# Patient Record
Sex: Male | Born: 1975 | Race: White | Hispanic: No | Marital: Married | Smoking: Current every day smoker
Health system: Southern US, Community
[De-identification: ages and names within clinical notes are randomized; demographics above are authoritative.]

## PROBLEM LIST (undated history)

## (undated) DIAGNOSIS — F329 Major depressive disorder, single episode, unspecified: Secondary | ICD-10-CM

## (undated) DIAGNOSIS — G8929 Other chronic pain: Secondary | ICD-10-CM

## (undated) DIAGNOSIS — F32A Depression, unspecified: Secondary | ICD-10-CM

## (undated) HISTORY — PX: DENTAL RESTORATION/EXTRACTION WITH X-RAY: SHX5796

---

## 2005-08-10 ENCOUNTER — Emergency Department (HOSPITAL_COMMUNITY): Admission: EM | Admit: 2005-08-10 | Discharge: 2005-08-10 | Payer: Self-pay | Admitting: Family Medicine

## 2005-08-14 ENCOUNTER — Emergency Department (HOSPITAL_COMMUNITY): Admission: EM | Admit: 2005-08-14 | Discharge: 2005-08-14 | Payer: Self-pay | Admitting: Family Medicine

## 2007-01-05 DIAGNOSIS — S62609A Fracture of unspecified phalanx of unspecified finger, initial encounter for closed fracture: Secondary | ICD-10-CM | POA: Insufficient documentation

## 2007-03-22 ENCOUNTER — Ambulatory Visit: Payer: Self-pay | Admitting: Internal Medicine

## 2007-03-31 ENCOUNTER — Ambulatory Visit (HOSPITAL_COMMUNITY): Admission: RE | Admit: 2007-03-31 | Discharge: 2007-03-31 | Payer: Self-pay | Admitting: Internal Medicine

## 2007-04-09 ENCOUNTER — Emergency Department (HOSPITAL_COMMUNITY): Admission: EM | Admit: 2007-04-09 | Discharge: 2007-04-09 | Payer: Self-pay | Admitting: *Deleted

## 2007-04-14 ENCOUNTER — Emergency Department (HOSPITAL_COMMUNITY): Admission: EM | Admit: 2007-04-14 | Discharge: 2007-04-14 | Payer: Self-pay | Admitting: Emergency Medicine

## 2007-06-30 DIAGNOSIS — M545 Low back pain, unspecified: Secondary | ICD-10-CM | POA: Insufficient documentation

## 2007-10-08 ENCOUNTER — Ambulatory Visit: Payer: Self-pay | Admitting: Internal Medicine

## 2007-10-08 DIAGNOSIS — M542 Cervicalgia: Secondary | ICD-10-CM

## 2007-10-08 DIAGNOSIS — F17200 Nicotine dependence, unspecified, uncomplicated: Secondary | ICD-10-CM

## 2007-10-08 DIAGNOSIS — B360 Pityriasis versicolor: Secondary | ICD-10-CM | POA: Insufficient documentation

## 2007-10-08 DIAGNOSIS — K029 Dental caries, unspecified: Secondary | ICD-10-CM | POA: Insufficient documentation

## 2007-10-13 ENCOUNTER — Encounter (INDEPENDENT_AMBULATORY_CARE_PROVIDER_SITE_OTHER): Payer: Self-pay | Admitting: Internal Medicine

## 2007-10-13 LAB — CONVERTED CEMR LAB
CO2: 26 meq/L (ref 19–32)
Calcium: 9.4 mg/dL (ref 8.4–10.5)
Chloride: 104 meq/L (ref 96–112)
Cholesterol: 166 mg/dL (ref 0–200)
Creatinine, Ser: 0.88 mg/dL (ref 0.40–1.50)
Eosinophils Relative: 4 % (ref 0–5)
Glucose, Bld: 79 mg/dL (ref 70–99)
HCT: 46.5 % (ref 39.0–52.0)
Hemoglobin: 15.7 g/dL (ref 13.0–17.0)
Lymphocytes Relative: 29 % (ref 12–46)
Lymphs Abs: 2.8 10*3/uL (ref 0.7–4.0)
Monocytes Absolute: 0.8 10*3/uL (ref 0.1–1.0)
RBC: 5.44 M/uL (ref 4.22–5.81)
Total Bilirubin: 0.5 mg/dL (ref 0.3–1.2)
Total CHOL/HDL Ratio: 3.7
Total Protein: 7.2 g/dL (ref 6.0–8.3)
Triglycerides: 132 mg/dL (ref <150)
VLDL: 26 mg/dL (ref 0–40)
WBC: 9.8 10*3/uL (ref 4.0–10.5)

## 2008-02-11 ENCOUNTER — Ambulatory Visit: Payer: Self-pay | Admitting: Internal Medicine

## 2008-03-16 ENCOUNTER — Ambulatory Visit: Payer: Self-pay | Admitting: Internal Medicine

## 2008-03-16 DIAGNOSIS — G44209 Tension-type headache, unspecified, not intractable: Secondary | ICD-10-CM

## 2008-05-14 ENCOUNTER — Emergency Department (HOSPITAL_COMMUNITY): Admission: EM | Admit: 2008-05-14 | Discharge: 2008-05-14 | Payer: Self-pay | Admitting: Emergency Medicine

## 2010-11-01 ENCOUNTER — Emergency Department (HOSPITAL_COMMUNITY)
Admission: EM | Admit: 2010-11-01 | Discharge: 2010-11-01 | Payer: Self-pay | Source: Home / Self Care | Admitting: Emergency Medicine

## 2011-07-22 LAB — WOUND CULTURE

## 2011-11-26 ENCOUNTER — Emergency Department (HOSPITAL_COMMUNITY): Payer: Self-pay

## 2011-11-26 ENCOUNTER — Encounter (HOSPITAL_COMMUNITY): Payer: Self-pay

## 2011-11-26 ENCOUNTER — Emergency Department (HOSPITAL_COMMUNITY)
Admission: EM | Admit: 2011-11-26 | Discharge: 2011-11-26 | Disposition: A | Discharge status: Home / Self Care | Payer: Self-pay | Attending: Emergency Medicine | Admitting: Emergency Medicine

## 2011-11-26 DIAGNOSIS — M79609 Pain in unspecified limb: Secondary | ICD-10-CM | POA: Insufficient documentation

## 2011-11-26 DIAGNOSIS — M79643 Pain in unspecified hand: Secondary | ICD-10-CM

## 2011-11-26 MED ORDER — IBUPROFEN 600 MG PO TABS: 600.0000 mg | ORAL_TABLET | Freq: Four times a day (QID) | ORAL | Status: AC | PRN

## 2011-11-26 MED ORDER — IBUPROFEN 800 MG PO TABS
800.0000 mg | ORAL_TABLET | Freq: Once | ORAL | Status: DC
  Filled 2011-11-26: qty 1

## 2011-11-26 NOTE — ED Notes (Signed)
Pt presents with 1 month h/o R hand pain.  Pt reports pain x 1 month, unsure if he injured hand.  Pt reports swelling to hand.

## 2011-11-26 NOTE — Discharge Instructions (Signed)
Return to the ED with any concerns including worsening pain, swelling of your arm or hand, discoloration of the skin of your arm hand or fingers, or any other alarming symptoms.

## 2011-11-26 NOTE — ED Provider Notes (Signed)
History     CSN: [id number]  Arrival date & time 11/26/11  1335   First MD Initiated Contact with Patient 11/26/11 1646      Chief Complaint  Patient presents with  . Hand Pain    (Consider location/radiation/quality/duration/timing/severity/associated sxs/prior treatment) HPI Patient presents with complaint of pain in his hands. He states that he has had an injury to his left hand several months ago but then pain began to worsen this morning. He said no other injury. He denies any weakness or numbness. There is no erythema of his hand. Movement and palpation make the pain worse. Pain has been constant and sharp today. There no associated systemic symptoms.  Pain is located over the dorsum of his right hand.  There is no weakness of the hand  History reviewed. No pertinent past medical history.  History reviewed. No pertinent past surgical history.  No family history on file.  History  Substance Use Topics  . Smoking status: Current Everyday Smoker -- 1.0 packs/day  . Smokeless tobacco: Not on file  . Alcohol Use: Yes      Review of Systems ROS reviewed and otherwise negative except for mentioned in HPI  Allergies  Review of patient's allergies indicates no known allergies.  Home Medications   Current Outpatient Rx  Name Route Sig Dispense Refill  . ALPRAZOLAM 2 MG PO TABS Oral Take 2 mg by mouth 4 (four) times daily.    . CYCLOBENZAPRINE HCL 10 MG PO TABS Oral Take 10 mg by mouth 2 (two) times daily as needed. For muscle pain    . SERTRALINE HCL 50 MG PO TABS Oral Take 50 mg by mouth daily.    Marland Kitchen ZOLPIDEM TARTRATE 10 MG PO TABS Oral Take 10 mg by mouth at bedtime as needed. For sleep    . IBUPROFEN 600 MG PO TABS Oral Take 1 tablet (600 mg total) by mouth every 6 (six) hours as needed for pain. 30 tablet 0    BP 113/80  Pulse 92  Temp(Src) 98.2 F (36.8 C) (Oral)  Resp 18  Ht 5\' 4"  (1.626 m)  Wt 132 lb (59.875 kg)  BMI 22.66 kg/m2  SpO2 96% Vitals  reviewed Physical Exam Physical Examination: General appearance - alert, well appearing, and in no distress Mental status - alert, oriented to person, place, and time Chest - clear to auscultation, no wheezes, rales or rhonchi, symmetric air entry Heart - normal rate, regular rhythm, normal S1, S2, no murmurs, rubs, clicks or gallops Neurological - alert, oriented, normal speech, grip strength 5/5 and symmetric bilaterally, sensation/flexion and extension intact in fingers Musculoskeletal - no joint tenderness, deformity or swelling, no snuffbox tenderness, small approx 1cm area of prominence over right dorsal hand, no overlying erythema, hand diffusely tender to palpation- pt moves hand away with even lightest touch of skin Extremities - peripheral pulses normal, no pedal edema, no clubbing or cyanosis Skin - normal coloration and turgor, no rashes  ED Course  Procedures (including critical care time)  Labs Reviewed - No data to display Dg Hand Complete Right  11/26/2011  *RADIOLOGY REPORT*  Clinical Data: Pain in metacarpal region.  Occasional numbness and tingling. History of injury in January.  RIGHT HAND - COMPLETE 3+ VIEW  Comparison: 11/01/2010.  Findings: The configuration of the fifth metacarpal is unchanged consistent with old healed fracture.  There is no evidence of acute fracture.  No dislocation is seen.  Alignment is normal.  Small benign-appearing cystic area in the  proximal aspect of the middle phalanx of the fifth finger is stable.  A tiny opaque density is seen projecting over the distal metaphysis of the middle phalanx of the index finger on the radial side. On the lateral image it is projecting near the dorsal surface.  On the previous study this density was seen as well as several adjacent opaque densities.  This is consistent with a small metallic density foreign body.  IMPRESSION: No acute fracture or acute bony abnormality.  Old healed fracture of the fifth metacarpal.  1 mm  metallic density opaque foreign body is seen in the soft tissues of the index finger.  It is unchanged in position from the previous study.  Original Report Authenticated By: Crawford Givens, M.D.     1. Pain in hand       MDM  Patient presents with complaint of pain in his right hand. He has a mild area of swelling over the dorsum of his hand which is consistent in appearance with possible ganglion cyst. He does have a remote fracture on his x-ray which may be contributing to the area of swelling. I do not suspect an acute injury or infection or any other acute emergent condition and his hand at this time. Patient was recommended to take ibuprofen for his discomfort and was discharged in good condition. He was advised to followup with primary care Dr. information was provided to contact primary care.        Ethelda Chick, MD 11/28/11 [phone]

## 2014-06-30 ENCOUNTER — Emergency Department (HOSPITAL_COMMUNITY)
Admission: EM | Admit: 2014-06-30 | Discharge: 2014-06-30 | Disposition: A | Discharge status: Home / Self Care | Payer: Medicaid Other | Attending: Emergency Medicine | Admitting: Emergency Medicine

## 2014-06-30 ENCOUNTER — Emergency Department (HOSPITAL_COMMUNITY): Payer: Medicaid Other

## 2014-06-30 ENCOUNTER — Encounter (HOSPITAL_COMMUNITY): Payer: Self-pay | Admitting: Emergency Medicine

## 2014-06-30 DIAGNOSIS — IMO0002 Reserved for concepts with insufficient information to code with codable children: Secondary | ICD-10-CM | POA: Insufficient documentation

## 2014-06-30 DIAGNOSIS — M5489 Other dorsalgia: Secondary | ICD-10-CM

## 2014-06-30 DIAGNOSIS — Z79899 Other long term (current) drug therapy: Secondary | ICD-10-CM | POA: Diagnosis not present

## 2014-06-30 DIAGNOSIS — W108XXA Fall (on) (from) other stairs and steps, initial encounter: Secondary | ICD-10-CM | POA: Diagnosis not present

## 2014-06-30 DIAGNOSIS — Y9389 Activity, other specified: Secondary | ICD-10-CM | POA: Insufficient documentation

## 2014-06-30 DIAGNOSIS — Y9289 Other specified places as the place of occurrence of the external cause: Secondary | ICD-10-CM | POA: Diagnosis not present

## 2014-06-30 DIAGNOSIS — F17200 Nicotine dependence, unspecified, uncomplicated: Secondary | ICD-10-CM | POA: Diagnosis not present

## 2014-06-30 DIAGNOSIS — W1809XA Striking against other object with subsequent fall, initial encounter: Secondary | ICD-10-CM | POA: Diagnosis not present

## 2014-06-30 DIAGNOSIS — W19XXXA Unspecified fall, initial encounter: Secondary | ICD-10-CM

## 2014-06-30 MED ORDER — METHOCARBAMOL 500 MG PO TABS: 500.0000 mg | ORAL_TABLET | Freq: Four times a day (QID) | ORAL | Status: DC | PRN

## 2014-06-30 MED ORDER — HYDROCODONE-ACETAMINOPHEN 5-325 MG PO TABS: 2.0000 | ORAL_TABLET | ORAL | Status: DC | PRN

## 2014-06-30 MED ORDER — HYDROCODONE-ACETAMINOPHEN 5-325 MG PO TABS
2.0000 | ORAL_TABLET | Freq: Once | ORAL | Status: AC
  Administered 2014-06-30: 2 via ORAL
  Filled 2014-06-30: qty 2

## 2014-06-30 NOTE — ED Provider Notes (Signed)
Medical screening examination/treatment/procedure(s) were performed by non-physician practitioner and as supervising physician I was immediately available for consultation/collaboration.    Giani Winther D Everest Hacking, MD 06/30/14 1915 

## 2014-06-30 NOTE — ED Provider Notes (Signed)
CSN: [id number]     Arrival date & time 06/30/14  4098 History  This chart was scribed for non-physician practitioner Roger Dredge, PA-C working with Vida Roller, MD by Leone Payor, ED Scribe. This patient was seen in room TR11C/TR11C and the patient's care was started at 10:26 AM.    Chief Complaint  Patient presents with  . Back Pain    The history is provided by the patient. No language interpreter was used.    HPI Comments: Roger Oconnell is a 38 y.o. male who presents to the Emergency Department complaining of a fall that occurred PTA. Patient states he slipped on wet stairs and fell, striking his lower back. He denies head injury or LOC. He complains of constant, unchanged, non-radiating lower back pain that is described as stabbing. He states the pain is aggravated by walking and alleviated with rest. He has not taken OTC medication for his symptoms. He denies numbness, weakness, head pain, neck pain, chest pain, nausea, vomiting, bowel/bladder incontinence.   History reviewed. No pertinent past medical history. History reviewed. No pertinent past surgical history. History reviewed. No pertinent family history. History  Substance Use Topics  . Smoking status: Current Every Day Smoker -- 1.00 packs/day  . Smokeless tobacco: Not on file  . Alcohol Use: Yes    Review of Systems  Respiratory: Negative for shortness of breath.   Cardiovascular: Negative for chest pain.  Gastrointestinal: Negative for nausea, vomiting and abdominal pain.  Musculoskeletal: Positive for back pain. Negative for neck pain.  Skin: Negative for wound.  Neurological: Negative for dizziness, syncope, weakness, numbness and headaches.      Allergies  Review of patient's allergies indicates no known allergies.  Home Medications   Prior to Admission medications   Medication Sig Start Date End Date Taking? Authorizing Provider  alprazolam Prudy Feeler) 2 MG tablet Take 2 mg by mouth 4 (four) times daily.     Historical Provider, MD  cyclobenzaprine (FLEXERIL) 10 MG tablet Take 10 mg by mouth 2 (two) times daily as needed. For muscle pain    Historical Provider, MD  sertraline (ZOLOFT) 50 MG tablet Take 50 mg by mouth daily.    Historical Provider, MD  zolpidem (AMBIEN) 10 MG tablet Take 10 mg by mouth at bedtime as needed. For sleep    Historical Provider, MD   BP 112/74  Pulse 83  Temp(Src) 98.1 F (36.7 C) (Oral)  Resp 12  SpO2 100% Physical Exam  Nursing note and vitals reviewed. Constitutional: He appears well-developed and well-nourished. No distress.  HENT:  Head: Normocephalic and atraumatic.  Neck: Neck supple.  Cardiovascular: Normal rate.   Pulmonary/Chest: Effort normal.  Abdominal: Soft. He exhibits no distension and no mass. There is no tenderness. There is no rebound and no guarding.  Musculoskeletal:  Tenderness to palpation over lower thoracic and upper lumbar spine. No crepitus or stepoffs. No skin changes. Lower extremities:  Strength 5/5, sensation intact, distal pulses intact.  Bilateral hips non tender.   Neurological: He is alert.  Skin: He is not diaphoretic.  Gait is normal.   ED Course  Procedures (including critical care time)  DIAGNOSTIC STUDIES: Oxygen Saturation is 100% on RA, normal by my interpretation.    COORDINATION OF CARE: 10:32 AM Will order XRAY of thoracic and lumbar spine. Discussed treatment plan with pt at bedside and pt agreed to plan.   Labs Review Labs Reviewed - No data to display  Imaging Review Dg Thoracic Spine 2 View  06/30/2014   CLINICAL DATA:  Back pain.  Fell downstairs this morning.  EXAM: THORACIC SPINE - 2 VIEW  COMPARISON:  None.  FINDINGS: There is no evidence of thoracic spine fracture. Alignment is normal. No other significant bone abnormalities are identified.  IMPRESSION: Negative.   Electronically Signed   By: Amie Portland M.D.   On: 06/30/2014 11:17   Dg Lumbar Spine Complete  06/30/2014   CLINICAL DATA:  Status  post fall.  Low back pain.  EXAM: LUMBAR SPINE - COMPLETE 4+ VIEW  COMPARISON:  Plain films lumbar spine 11/01/2010.  FINDINGS: There is no evidence of lumbar spine fracture. Alignment is normal. Intervertebral disc spaces are maintained.  IMPRESSION: Negative exam.   Electronically Signed   By: Drusilla Kanner M.D.   On: 06/30/2014 11:17     EKG Interpretation None      MDM   Final diagnoses:  Fall, initial encounter  Midline back pain, unspecified location    Afebrile, nontoxic patient with low back pain after slip and fall on steps, hitting back on stairs. No red flags. Xrays negative.  Discussed result, findings, treatment, and follow up  with patient.  Pt given return precautions.  Pt verbalizes understanding and agrees with plan.      I personally performed the services described in this documentation, which was scribed in my presence. The recorded information has been reviewed and is accurate.   Roger Dredge, PA-C 06/30/14 1214

## 2014-06-30 NOTE — ED Notes (Signed)
Slipped on stairs hitting his back.  Having lower back pain.

## 2014-06-30 NOTE — Discharge Planning (Signed)
[facility] with the patient regarding primary care resources and establishing care with a provider. Orange card application and instructions provided. Resource guide and my contact information also given for any future questions or concerns. No other community liaison needs identified at this time.

## 2014-06-30 NOTE — Discharge Instructions (Signed)
Read the information below.  Use the prescribed medication as directed.  Please discuss all new medications with your pharmacist.  Do not take additional tylenol while taking the prescribed pain medication to avoid overdose.  You may return to the Emergency Department at any time for worsening condition or any new symptoms that concern you.   If you develop fevers, loss of control of bowel or bladder, weakness or numbness in your legs, or are unable to walk, return to the ER for a recheck.    Back Pain, Adult Back pain is very common. The pain often gets better over time. The cause of back pain is usually not dangerous. Most people can learn to manage their back pain on their own.  HOME CARE   Stay active. Start with short walks on flat ground if you can. Try to walk farther each day.  Do not sit, drive, or stand in one place for more than 30 minutes. Do not stay in bed.  Do not avoid exercise or work. Activity can help your back heal faster.  Be careful when you bend or lift an object. Bend at your knees, keep the object close to you, and do not twist.  Sleep on a firm mattress. Lie on your side, and bend your knees. If you lie on your back, put a pillow under your knees.  Only take medicines as told by your doctor.  Put ice on the injured area.  Put ice in a plastic bag.  Place a towel between your skin and the bag.  Leave the ice on for 15-20 minutes, 03-04 times a day for the first 2 to 3 days. After that, you can switch between ice and heat packs.  Ask your doctor about back exercises or massage.  Avoid feeling anxious or stressed. Find good ways to deal with stress, such as exercise. GET HELP RIGHT AWAY IF:   Your pain does not go away with rest or medicine.  Your pain does not go away in 1 week.  You have new problems.  You do not feel well.  The pain spreads into your legs.  You cannot control when you poop (bowel movement) or pee (urinate).  Your arms or legs feel  weak or lose feeling (numbness).  You feel sick to your stomach (nauseous) or throw up (vomit).  You have belly (abdominal) pain.  You feel like you may pass out (faint). MAKE SURE YOU:   Understand these instructions.  Will watch your condition.  Will get help right away if you are not doing well or get worse. Document Released: 03/10/2008 Document Revised: 12/15/2011 Document Reviewed: 01/24/2014 [facility] Patient Information 2015 [place], [place]. This information is not intended to replace advice given to you by your health care provider. Make sure you discuss any questions you have with your health care provider.  Fall Prevention and Home Safety Falls cause injuries and can affect all age groups. It is possible to prevent falls.  HOW TO PREVENT FALLS  Wear shoes with rubber soles that do not have an opening for your toes.  Keep the inside and outside of your house well lit.  Use night lights throughout your home.  Remove clutter from floors.  Clean up floor spills.  Remove throw rugs or fasten them to the floor with carpet tape.  Do not place electrical cords across pathways.  Put grab bars by your tub, shower, and toilet. Do not use towel bars as grab bars.  Put handrails on  both sides of the stairway. Fix loose handrails.  Do not climb on stools or stepladders, if possible.  Do not wax your floors.  Repair uneven or unsafe sidewalks, walkways, or stairs.  Keep items you use a lot within reach.  Be aware of pets.  Keep emergency numbers next to the telephone.  Put smoke detectors in your home and near bedrooms. Ask your doctor what other things you can do to prevent falls. Document Released: 07/19/2009 Document Revised: 03/23/2012 Document Reviewed: 12/23/2011 [facility] Patient Information 2015 [place], [place]. This information is not intended to replace advice given to you by your health care provider. Make sure you discuss any questions you have with your  health care provider.

## 2014-07-14 ENCOUNTER — Ambulatory Visit: Payer: Medicaid Other | Attending: Internal Medicine

## 2015-07-23 ENCOUNTER — Encounter (HOSPITAL_COMMUNITY): Payer: Self-pay | Admitting: Emergency Medicine

## 2015-07-23 ENCOUNTER — Emergency Department (HOSPITAL_COMMUNITY)
Admission: EM | Admit: 2015-07-23 | Discharge: 2015-07-24 | Disposition: A | Discharge status: Home / Self Care | Payer: Medicaid Other | Attending: Emergency Medicine | Admitting: Emergency Medicine

## 2015-07-23 DIAGNOSIS — Z720 Tobacco use: Secondary | ICD-10-CM | POA: Diagnosis not present

## 2015-07-23 DIAGNOSIS — L03211 Cellulitis of face: Secondary | ICD-10-CM | POA: Diagnosis not present

## 2015-07-23 DIAGNOSIS — L0201 Cutaneous abscess of face: Secondary | ICD-10-CM | POA: Diagnosis present

## 2015-07-23 DIAGNOSIS — Z79899 Other long term (current) drug therapy: Secondary | ICD-10-CM | POA: Diagnosis not present

## 2015-07-23 NOTE — ED Notes (Signed)
Pt. reports skin abscess at left lower chin with drainage onset 3 days ago .

## 2015-07-24 LAB — BASIC METABOLIC PANEL
ANION GAP: 7 (ref 5–15)
BUN: 11 mg/dL (ref 6–20)
CALCIUM: 8.8 mg/dL — AB (ref 8.9–10.3)
CHLORIDE: 102 mmol/L (ref 101–111)
CO2: 27 mmol/L (ref 22–32)
CREATININE: 0.87 mg/dL (ref 0.61–1.24)
GFR calc non Af Amer: >60 mL/min (ref >60)
GLUCOSE: 95 mg/dL (ref 65–99)
Potassium: 4.2 mmol/L (ref 3.5–5.1)
Sodium: 136 mmol/L (ref 135–145)

## 2015-07-24 LAB — CBC WITH DIFFERENTIAL/PLATELET
BASOS PCT: 1 %
Basophils Absolute: 0.1 10*3/uL (ref 0.0–0.1)
Eosinophils Absolute: 0.7 10*3/uL (ref 0.0–0.7)
Eosinophils Relative: 6 %
HEMATOCRIT: 40.5 % (ref 39.0–52.0)
HEMOGLOBIN: 13.8 g/dL (ref 13.0–17.0)
LYMPHS PCT: 33 %
Lymphs Abs: 3.9 10*3/uL (ref 0.7–4.0)
MCH: 29.2 pg (ref 26.0–34.0)
MCHC: 34.1 g/dL (ref 30.0–36.0)
MCV: 85.6 fL (ref 78.0–100.0)
MONO ABS: 1 10*3/uL (ref 0.1–1.0)
MONOS PCT: 8 %
NEUTROS ABS: 6.1 10*3/uL (ref 1.7–7.7)
NEUTROS PCT: 52 %
Platelets: 243 10*3/uL (ref 150–400)
RBC: 4.73 MIL/uL (ref 4.22–5.81)
RDW: 14.2 % (ref 11.5–15.5)
WBC: 11.7 10*3/uL — ABNORMAL HIGH (ref 4.0–10.5)

## 2015-07-24 MED ORDER — OXYCODONE-ACETAMINOPHEN 5-325 MG PO TABS: 2.0000 | ORAL_TABLET | ORAL | Status: DC | PRN

## 2015-07-24 MED ORDER — CLINDAMYCIN PHOSPHATE 900 MG/50ML IV SOLN
900.0000 mg | Freq: Once | INTRAVENOUS | Status: AC
  Administered 2015-07-24: 900 mg via INTRAVENOUS
  Filled 2015-07-24: qty 50

## 2015-07-24 MED ORDER — SODIUM CHLORIDE 0.9 % IV BOLUS (SEPSIS)
1000.0000 mL | Freq: Once | INTRAVENOUS | Status: AC
  Administered 2015-07-24: 1000 mL via INTRAVENOUS

## 2015-07-24 MED ORDER — OXYCODONE-ACETAMINOPHEN 5-325 MG PO TABS
2.0000 | ORAL_TABLET | Freq: Once | ORAL | Status: AC
  Administered 2015-07-24: 2 via ORAL
  Filled 2015-07-24: qty 2

## 2015-07-24 MED ORDER — CLINDAMYCIN HCL 150 MG PO CAPS: 150.0000 mg | ORAL_CAPSULE | Freq: Four times a day (QID) | ORAL | Status: DC

## 2015-07-24 NOTE — Discharge Instructions (Signed)
Cellulitis °Cellulitis is an infection of the skin and the tissue beneath it. The infected area is usually red and tender. Cellulitis occurs most often in the arms and lower legs.  °CAUSES  °Cellulitis is caused by bacteria that enter the skin through cracks or cuts in the skin. The most common types of bacteria that cause cellulitis are staphylococci and streptococci. °SIGNS AND SYMPTOMS  °· Redness and warmth. °· Swelling. °· Tenderness or pain. °· Fever. °DIAGNOSIS  °Your health care provider can usually determine what is wrong based on a physical exam. Blood tests may also be done. °TREATMENT  °Treatment usually involves taking an antibiotic medicine. °HOME CARE INSTRUCTIONS  °· Take your antibiotic medicine as directed by your health care provider. Finish the antibiotic even if you start to feel better. °· Keep the infected arm or leg elevated to reduce swelling. °· Apply a warm cloth to the affected area up to 4 times per day to relieve pain. °· Take medicines only as directed by your health care provider. °· Keep all follow-up visits as directed by your health care provider. °SEEK MEDICAL CARE IF:  °· You notice red streaks coming from the infected area. °· Your red area gets larger or turns dark in color. °· Your bone or joint underneath the infected area becomes painful after the skin has healed. °· Your infection returns in the same area or another area. °· You notice a swollen bump in the infected area. °· You develop new symptoms. °· You have a fever. °SEEK IMMEDIATE MEDICAL CARE IF:  °· You feel very sleepy. °· You develop vomiting or diarrhea. °· You have a general ill feeling (malaise) with muscle aches and pains. °  °This information is not intended to replace advice given to you by your health care provider. Make sure you discuss any questions you have with your health care provider. °  °Document Released: 07/02/2005 Document Revised: 06/13/2015 Document Reviewed: 12/08/2011 °Elsevier Interactive  Patient Education ©2016 [employer] ° °Abscess °An abscess is an infected area that contains a collection of pus and debris. It can occur in almost any part of the body. An abscess is also known as a furuncle or boil. °CAUSES  °An abscess occurs when tissue gets infected. This can occur from blockage of oil or sweat glands, infection of hair follicles, or a minor injury to the skin. As the body tries to fight the infection, pus collects in the area and creates pressure under the skin. This pressure causes pain. People with weakened immune systems have difficulty fighting infections and get certain abscesses more often.  °SYMPTOMS °Usually an abscess develops on the skin and becomes a painful mass that is red, warm, and tender. If the abscess forms under the skin, you may feel a moveable soft area under the skin. Some abscesses break open (rupture) on their own, but most will continue to get worse without care. The infection can spread deeper into the body and eventually into the bloodstream, causing you to feel ill.  °DIAGNOSIS  °Your caregiver will take your medical history and perform a physical exam. A sample of fluid may also be taken from the abscess to determine what is causing your infection. °TREATMENT  °Your caregiver may prescribe antibiotic medicines to fight the infection. However, taking antibiotics alone usually does not cure an abscess. Your caregiver may need to make a small cut (incision) in the abscess to drain the pus. In some cases, gauze is packed into the abscess to reduce   pain and to continue draining the area. °HOME CARE INSTRUCTIONS  °· Only take over-the-counter or prescription medicines for pain, discomfort, or fever as directed by your caregiver. °· If you were prescribed antibiotics, take them as directed. Finish them even if you start to feel better. °· If gauze is used, follow your caregiver's directions for changing the gauze. °· To avoid spreading the infection: °¨ Keep your  draining abscess covered with a bandage. °¨ Wash your hands well. °¨ Do not share personal care items, towels, or whirlpools with others. °¨ Avoid skin contact with others. °· Keep your skin and clothes clean around the abscess. °· Keep all follow-up appointments as directed by your caregiver. °SEEK MEDICAL CARE IF:  °· You have increased pain, swelling, redness, fluid drainage, or bleeding. °· You have muscle aches, chills, or a general ill feeling. °· You have a fever. °MAKE SURE YOU:  °· Understand these instructions. °· Will watch your condition. °· Will get help right away if you are not doing well or get worse. °  °This information is not intended to replace advice given to you by your health care provider. Make sure you discuss any questions you have with your health care provider. °  °Document Released: 07/02/2005 Document Revised: 03/23/2012 Document Reviewed: 12/05/2011 °Elsevier Interactive Patient Education ©2016 Elsevier Inc. ° °

## 2015-07-24 NOTE — ED Provider Notes (Signed)
CSN: [id number]     Arrival date & time 07/23/15  2310 History   First MD Initiated Contact with Patient 07/23/15 2337     Chief Complaint  Patient presents with  . Abscess     (Consider location/radiation/quality/duration/timing/severity/associated sxs/prior Treatment) Patient is a 39 y.o. male presenting with abscess. The history is provided by the patient. No language interpreter was used.  Abscess Location:  Face Facial abscess location:  Face Size:  3 Abscess quality: draining, redness and warmth   Red streaking: no   Progression:  Worsening Chronicity:  New Context: not skin injury   Relieved by:  Nothing Worsened by:  Nothing tried Ineffective treatments:  None tried Associated symptoms: fever     History reviewed. No pertinent past medical history. History reviewed. No pertinent past surgical history. No family history on file. Social History  Substance Use Topics  . Smoking status: Current Every Day Smoker -- 0.00 packs/day  . Smokeless tobacco: None  . Alcohol Use: Yes    Review of Systems  Constitutional: Positive for fever.  All other systems reviewed and are negative.     Allergies  Review of patient's allergies indicates no known allergies.  Home Medications   Prior to Admission medications   Medication Sig Start Date End Date Taking? Authorizing Provider  alprazolam Prudy Feeler) 2 MG tablet Take 2 mg by mouth 4 (four) times daily.    Historical Provider, MD  cyclobenzaprine (FLEXERIL) 10 MG tablet Take 10 mg by mouth 2 (two) times daily as needed. For muscle pain    Historical Provider, MD  HYDROcodone-acetaminophen (NORCO/VICODIN) 5-325 MG per tablet Take 2 tablets by mouth every 4 (four) hours as needed for moderate pain or severe pain. 06/30/14   [name], PA-C  methocarbamol (ROBAXIN) 500 MG tablet Take 1 tablet (500 mg total) by mouth every 6 (six) hours as needed for muscle spasms (or pain). 06/30/14   [name], PA-C  sertraline (ZOLOFT) 50 MG  tablet Take 50 mg by mouth daily.    Historical Provider, MD  zolpidem (AMBIEN) 10 MG tablet Take 10 mg by mouth at bedtime as needed. For sleep    Historical Provider, MD   BP 99/64 mmHg  Pulse 85  Temp(Src) 99.3 F (37.4 C) (Oral)  Resp 20  Ht  (1.626 m)  Wt 130 lb (58.968 kg)  BMI 22.30 kg/m2  SpO2 96% Physical Exam  Constitutional: He is oriented to person, place, and time. He appears well-developed and well-nourished.  HENT:  Head: Normocephalic and atraumatic.  Open draining abscess left chin,  Erythema to chest  Eyes: Pupils are equal, round, and reactive to light.  Neck:  Erythema left neck  Cardiovascular: Normal rate.   Musculoskeletal: Normal range of motion.  Neurological: He is alert and oriented to person, place, and time.  Skin: Skin is warm.  Psychiatric: He has a normal mood and affect.  Nursing note and vitals reviewed.   ED Course  Procedures (including critical care time) Labs Review Labs Reviewed  CBC WITH DIFFERENTIAL/PLATELET - Abnormal; Notable for the following:    WBC 11.7 (*)    All other components within normal limits  BASIC METABOLIC PANEL    Imaging Review No results found. I have personally reviewed and evaluated these images and lab results as part of my medical decision-making.   EKG Interpretation None      MDM Pt initially refused Iv antibiotics,  Pt  given iv clindamycin after discussing options.  Marland Kitchen  Pt advised he needs to recheck here Wednesday morning.  Soak area.    Final diagnoses:  Cellulitis, face    Clindamycin 300mg  28 one po qid Percocet [address]16     Roger Oconnell Roger MadisonSofia, PA-C 07/24/15 0123  Elson AreasLeslie Roger Aziel Morgan, PA-C 07/24/15 0149  Rolan BuccoMelanie Belfi, MD 07/25/15 2122

## 2015-07-24 NOTE — ED Notes (Signed)
Pt refused IV and blood draw; Pt states he does not want to be here long;

## 2015-12-11 ENCOUNTER — Encounter (HOSPITAL_COMMUNITY): Payer: Self-pay | Admitting: Cardiology

## 2015-12-11 ENCOUNTER — Emergency Department (HOSPITAL_COMMUNITY)
Admission: EM | Admit: 2015-12-11 | Discharge: 2015-12-11 | Disposition: A | Discharge status: Home / Self Care | Payer: Medicaid Other | Attending: Emergency Medicine | Admitting: Emergency Medicine

## 2015-12-11 DIAGNOSIS — Y9389 Activity, other specified: Secondary | ICD-10-CM | POA: Insufficient documentation

## 2015-12-11 DIAGNOSIS — S61411A Laceration without foreign body of right hand, initial encounter: Secondary | ICD-10-CM | POA: Diagnosis not present

## 2015-12-11 DIAGNOSIS — Z79899 Other long term (current) drug therapy: Secondary | ICD-10-CM | POA: Diagnosis not present

## 2015-12-11 DIAGNOSIS — S6991XA Unspecified injury of right wrist, hand and finger(s), initial encounter: Secondary | ICD-10-CM | POA: Diagnosis present

## 2015-12-11 DIAGNOSIS — Y9289 Other specified places as the place of occurrence of the external cause: Secondary | ICD-10-CM | POA: Diagnosis not present

## 2015-12-11 DIAGNOSIS — Y998 Other external cause status: Secondary | ICD-10-CM | POA: Diagnosis not present

## 2015-12-11 DIAGNOSIS — Z23 Encounter for immunization: Secondary | ICD-10-CM | POA: Diagnosis not present

## 2015-12-11 DIAGNOSIS — W260XXA Contact with knife, initial encounter: Secondary | ICD-10-CM | POA: Diagnosis not present

## 2015-12-11 DIAGNOSIS — F17210 Nicotine dependence, cigarettes, uncomplicated: Secondary | ICD-10-CM | POA: Insufficient documentation

## 2015-12-11 MED ORDER — HYDROCODONE-ACETAMINOPHEN 5-325 MG PO TABS: 2.0000 | ORAL_TABLET | ORAL | Status: DC | PRN

## 2015-12-11 MED ORDER — OXYCODONE-ACETAMINOPHEN 5-325 MG PO TABS
2.0000 | ORAL_TABLET | Freq: Once | ORAL | Status: AC
  Administered 2015-12-11: 2 via ORAL
  Filled 2015-12-11: qty 2

## 2015-12-11 MED ORDER — LIDOCAINE HCL (PF) 1 % IJ SOLN
5.0000 mL | Freq: Once | INTRAMUSCULAR | Status: AC
  Administered 2015-12-11: 5 mL via INTRADERMAL
  Filled 2015-12-11: qty 5

## 2015-12-11 MED ORDER — TETANUS-DIPHTH-ACELL PERTUSSIS 5-2.5-18.5 LF-MCG/0.5 IM SUSP
0.5000 mL | Freq: Once | INTRAMUSCULAR | Status: AC
  Administered 2015-12-11: 0.5 mL via INTRAMUSCULAR
  Filled 2015-12-11: qty 0.5

## 2015-12-11 NOTE — Discharge Instructions (Signed)
Laceration Care, Adult Have your sutures removed in 10 days by her primary care physician. Return for redness or drainage from the wound. Take Tylenol for pain and hydrocodone for breakthrough pain. A laceration is a cut that goes through all layers of the skin. The cut also goes into the tissue that is right under the skin. Some cuts heal on their own. Others need to be closed with stitches (sutures), staples, skin adhesive strips, or wound glue. Taking care of your cut lowers your risk of infection and helps your cut to heal better. HOW TO TAKE CARE OF YOUR CUT For stitches or staples:  Keep the wound clean and dry.  If you were given a bandage (dressing), you should change it at least one time per day or as told by your doctor. You should also change it if it gets wet or dirty.  Keep the wound completely dry for the first 24 hours or as told by your doctor. After that time, you may take a shower or a bath. However, make sure that the wound is not soaked in water until after the stitches or staples have been removed.  Clean the wound one time each day or as told by your doctor:  Wash the wound with soap and water.  Rinse the wound with water until all of the soap comes off.  Pat the wound dry with a clean towel. Do not rub the wound.  After you clean the wound, put a thin layer of antibiotic ointment on it as told by your doctor. This ointment:  Helps to prevent infection.  Keeps the bandage from sticking to the wound.  Have your stitches or staples removed as told by your doctor. If your doctor used skin adhesive strips:   Keep the wound clean and dry.  If you were given a bandage, you should change it at least one time per day or as told by your doctor. You should also change it if it gets dirty or wet.  Do not get the skin adhesive strips wet. You can take a shower or a bath, but be careful to keep the wound dry.  If the wound gets wet, pat it dry with a clean towel. Do not  rub the wound.  Skin adhesive strips fall off on their own. You can trim the strips as the wound heals. Do not remove any strips that are still stuck to the wound. They will fall off after a while. If your doctor used wound glue:  Try to keep your wound dry, but you may briefly wet it in the shower or bath. Do not soak the wound in water, such as by swimming.  After you take a shower or a bath, gently pat the wound dry with a clean towel. Do not rub the wound.  Do not do any activities that will make you really sweaty until the skin glue has fallen off on its own.  Do not apply liquid, cream, or ointment medicine to your wound while the skin glue is still on.  If you were given a bandage, you should change it at least one time per day or as told by your doctor. You should also change it if it gets dirty or wet.  If a bandage is placed over the wound, do not let the tape for the bandage touch the skin glue.  Do not pick at the glue. The skin glue usually stays on for 5-10 days. Then, it falls off of the  skin. General Instructions  To help prevent scarring, make sure to cover your wound with sunscreen whenever you are outside after stitches are removed, after adhesive strips are removed, or when wound glue stays in place and the wound is healed. Make sure to wear a sunscreen of at least 30 SPF.  Take over-the-counter and prescription medicines only as told by your doctor.  If you were given antibiotic medicine or ointment, take or apply it as told by your doctor. Do not stop using the antibiotic even if your wound is getting better.  Do not scratch or pick at the wound.  Keep all follow-up visits as told by your doctor. This is important.  Check your wound every day for signs of infection. Watch for:  Redness, swelling, or pain.  Fluid, blood, or pus.  Raise (elevate) the injured area above the level of your heart while you are sitting or lying down, if possible. GET HELP  IF:  You got a tetanus shot and you have any of these problems at the injection site:  Swelling.  Very bad pain.  Redness.  Bleeding.  You have a fever.  A wound that was closed breaks open.  You notice a bad smell coming from your wound or your bandage.  You notice something coming out of the wound, such as wood or glass.  Medicine does not help your pain.  You have more redness, swelling, or pain at the site of your wound.  You have fluid, blood, or pus coming from your wound.  You notice a change in the color of your skin near your wound.  You need to change the bandage often because fluid, blood, or pus is coming from the wound.  You start to have a new rash.  You start to have numbness around the wound. GET HELP RIGHT AWAY IF:  You have very bad swelling around the wound.  Your pain suddenly gets worse and is very bad.  You notice painful lumps near the wound or on skin that is anywhere on your body.  You have a red streak going away from your wound.  The wound is on your hand or foot and you cannot move a finger or toe like you usually can.  The wound is on your hand or foot and you notice that your fingers or toes look pale or bluish.   This information is not intended to replace advice given to you by your health care provider. Make sure you discuss any questions you have with your health care provider.   Document Released: 03/10/2008 Document Revised: 02/06/2015 Document Reviewed: 09/18/2014 Elsevier Interactive Patient Education [employer]

## 2015-12-11 NOTE — ED Notes (Signed)
Pt reports he cut the inside of his right hand last night with a knife. Bleeding controlled.

## 2015-12-11 NOTE — ED Provider Notes (Signed)
History  By signing my name below, I, Roger Oconnell, attest that this documentation has been prepared under the direction and in the presence of Federated Department StoresHanna Patel-Mills, PA-C. Electronically Signed: Karle PlumberJennifer Oconnell, ED Scribe. 12/11/2015. 12:59 PM.  Chief Complaint  Patient presents with  . Extremity Laceration   The history is provided by the patient and medical records. No language interpreter was used.    HPI Comments:  Roger Oconnell is a 40 y.o. male who presents to the Emergency Department complaining of a laceration to the right palm across the 3rd and 4th distal metacarpals that occurred approximately 5-6 hours ago. He states he was changing the blade of an exacto-knife when it slipped and lacerated the area. He reports associated bleeding that has since been controlled and moderate pain. Touching the area increases the pain. He denies alleviating factors. He denies numbness, tingling or weakness of the right hand, fever, chills, nausea, vomiting, inability to move the right hand or fingers. He was unsure of his last tetanus vaccination so it was updated in triage.  History reviewed. No pertinent past medical history. History reviewed. No pertinent past surgical history. History reviewed. No pertinent family history. Social History  Substance Use Topics  . Smoking status: Current Every Day Smoker -- 0.00 packs/day  . Smokeless tobacco: None  . Alcohol Use: Yes    Review of Systems  Constitutional: Negative for fever and chills.  Gastrointestinal: Negative for nausea and vomiting.  Skin: Positive for wound. Negative for color change.  Neurological: Negative for weakness and numbness.   Allergies  Review of patient's allergies indicates no known allergies.  Home Medications   Prior to Admission medications   Medication Sig Start Date End Date Taking? Authorizing Provider  alprazolam Prudy Feeler(XANAX) 2 MG tablet Take 2 mg by mouth 4 (four) times daily.    Historical Provider, MD   clindamycin (CLEOCIN) 150 MG capsule Take 1 capsule (150 mg total) by mouth every 6 (six) hours. 07/24/15   Elson AreasLeslie K Sofia, PA-C  cyclobenzaprine (FLEXERIL) 10 MG tablet Take 10 mg by mouth 2 (two) times daily as needed. For muscle pain    Historical Provider, MD  HYDROcodone-acetaminophen (NORCO/VICODIN) 5-325 MG tablet Take 2 tablets by mouth every 4 (four) hours as needed. 12/11/15   Shakoya Gilmore Patel-Mills, PA-C  methocarbamol (ROBAXIN) 500 MG tablet Take 1 tablet (500 mg total) by mouth every 6 (six) hours as needed for muscle spasms (or pain). 06/30/14   Trixie DredgeEmily West, PA-C  oxyCODONE-acetaminophen (PERCOCET/ROXICET) 5-325 MG tablet Take 2 tablets by mouth every 4 (four) hours as needed for severe pain. 07/24/15   Elson AreasLeslie K Sofia, PA-C  sertraline (ZOLOFT) 50 MG tablet Take 50 mg by mouth daily.    Historical Provider, MD  zolpidem (AMBIEN) 10 MG tablet Take 10 mg by mouth at bedtime as needed. For sleep    Historical Provider, MD   Triage Vitals: BP 105/79 mmHg  Pulse 89  Temp(Src) 97.8 F (36.6 C) (Oral)  Resp 18  Wt 130 lb (58.968 kg)  SpO2 99%  Physical Exam  Constitutional: He is oriented to person, place, and time. He appears well-developed and well-nourished.  HENT:  Head: Normocephalic and atraumatic.  Eyes: Conjunctivae and EOM are normal.  Neck: Normal range of motion.  Cardiovascular: Normal rate.   Pulmonary/Chest: Effort normal. No respiratory distress.  Musculoskeletal: Normal range of motion.  Neurological: He is alert and oriented to person, place, and time.  Skin: Skin is warm and dry.  3 cm linear laceration  across the third and fourth distal metacarpals. No active bleeding. That can be visualized. Patient is able to flex and extend all fingers. 2+ radial pulse. Able to flex and extend at the wrist. No pallor. Less than 2 seconds capillary refill. Normal sensation.  Psychiatric: He has a normal mood and affect. His behavior is normal.  Nursing note and vitals  reviewed.   ED Course  Procedures (including critical care time) DIAGNOSTIC STUDIES: Oxygen Saturation is 99% on RA, normal by my interpretation.   COORDINATION OF CARE: 12:12 PM- Pt verbalizes understanding and agrees to plan.  LACERATION REPAIR PROCEDURE NOTE The patient's identification was confirmed and consent was obtained. This procedure was performed by Catha Gosselin, PA-C at 12:25 PM. Site: right palm across 3rd and 4th metacarpals Sterile procedures observed Anesthetic used (type and amt): Lidocaine 1% without Epinephrine (5 mLs) Suture type/size: 4-0 Prolene Length: 3 cm # of Sutures: 4 Technique: simple, interrupted Complexity: simple Antibx ointment applied Tetanus ordered Site anesthetized, irrigated with NS, explored without evidence of foreign body, wound well approximated, site covered with dry, sterile dressing.  Patient tolerated procedure well without complications. Instructions for care discussed verbally and patient provided with additional written instructions for homecare and f/u.  Medications  Tdap (BOOSTRIX) injection 0.5 mL (0.5 mLs Intramuscular Given 12/11/15 1135)  lidocaine (PF) (XYLOCAINE) 1 % injection 5 mL (5 mLs Intradermal Given by Other 12/11/15 1137)  oxyCODONE-acetaminophen (PERCOCET/ROXICET) 5-325 MG per tablet 2 tablet (2 tablets Oral Given 12/11/15 1230)    MDM   Final diagnoses:  Hand laceration, right, initial encounter    Tetanus updated in ED. Laceration occurred < 12 hours prior to repair. Discussed laceration care with pt and answered questions. Pt to f-u for suture removal in 10 days and wound check sooner should there be signs of dehiscence or infection. Pt is hemodynamically stable with no complaints prior to dc.   Medications  Tdap (BOOSTRIX) injection 0.5 mL (0.5 mLs Intramuscular Given 12/11/15 1135)  lidocaine (PF) (XYLOCAINE) 1 % injection 5 mL (5 mLs Intradermal Given by Other 12/11/15 1137)  oxyCODONE-acetaminophen  (PERCOCET/ROXICET) 5-325 MG per tablet 2 tablet (2 tablets Oral Given 12/11/15 1230)   Filed Vitals:   12/11/15 1119 12/11/15 1325  BP: 105/79 135/81  Pulse: 89 85  Temp: 97.8 F (36.6 C)   Resp: 18 16    I personally performed the services described in this documentation, which was scribed in my presence. The recorded information has been reviewed and is accurate.     Catha Gosselin, PA-C 12/11/15 1413  Mancel Bale, MD 12/11/15 1719

## 2015-12-11 NOTE — ED Notes (Signed)
Pt has lac to right hand with "Exacto-knife" at midnight last night. Unknown tetanus status.

## 2015-12-11 NOTE — ED Notes (Signed)
Suture cart set up at bedside  

## 2015-12-26 ENCOUNTER — Emergency Department (HOSPITAL_COMMUNITY)
Admission: EM | Admit: 2015-12-26 | Discharge: 2015-12-26 | Disposition: A | Discharge status: Home / Self Care | Payer: Medicaid Other | Attending: Emergency Medicine | Admitting: Emergency Medicine

## 2015-12-26 ENCOUNTER — Encounter (HOSPITAL_COMMUNITY): Payer: Self-pay | Admitting: *Deleted

## 2015-12-26 DIAGNOSIS — Z79899 Other long term (current) drug therapy: Secondary | ICD-10-CM | POA: Insufficient documentation

## 2015-12-26 DIAGNOSIS — L03113 Cellulitis of right upper limb: Secondary | ICD-10-CM | POA: Insufficient documentation

## 2015-12-26 DIAGNOSIS — Z4802 Encounter for removal of sutures: Secondary | ICD-10-CM | POA: Diagnosis present

## 2015-12-26 DIAGNOSIS — F17200 Nicotine dependence, unspecified, uncomplicated: Secondary | ICD-10-CM | POA: Diagnosis not present

## 2015-12-26 MED ORDER — IBUPROFEN 800 MG PO TABS: 800.0000 mg | ORAL_TABLET | Freq: Three times a day (TID) | ORAL | Status: DC

## 2015-12-26 MED ORDER — OXYCODONE-ACETAMINOPHEN 10-325 MG PO TABS: 1.0000 | ORAL_TABLET | Freq: Four times a day (QID) | ORAL | Status: DC | PRN

## 2015-12-26 MED ORDER — CEPHALEXIN 500 MG PO CAPS: 500.0000 mg | ORAL_CAPSULE | Freq: Four times a day (QID) | ORAL | Status: DC

## 2015-12-26 MED ORDER — SULFAMETHOXAZOLE-TRIMETHOPRIM 800-160 MG PO TABS: 1.0000 | ORAL_TABLET | Freq: Two times a day (BID) | ORAL | Status: AC

## 2015-12-26 NOTE — Discharge Instructions (Signed)
Cellulitis Cellulitis is an infection of the skin and the tissue beneath it. The infected area is usually red and tender. Cellulitis occurs most often in the arms and lower legs.  CAUSES  Cellulitis is caused by bacteria that enter the skin through cracks or cuts in the skin. The most common types of bacteria that cause cellulitis are staphylococci and streptococci. SIGNS AND SYMPTOMS   Redness and warmth.  Swelling.  Tenderness or pain.  Fever. DIAGNOSIS  Your health care provider can usually determine what is wrong based on a physical exam. Blood tests may also be done. TREATMENT  Treatment usually involves taking an antibiotic medicine. HOME CARE INSTRUCTIONS   Take your antibiotic medicine as directed by your health care provider. Finish the antibiotic even if you start to feel better.  Keep the infected arm or leg elevated to reduce swelling.  Apply a warm cloth to the affected area up to 4 times per day to relieve pain.  Take medicines only as directed by your health care provider.  Keep all follow-up visits as directed by your health care provider. SEEK MEDICAL CARE IF:   You notice red streaks coming from the infected area.  Your red area gets larger or turns dark in color.  Your bone or joint underneath the infected area becomes painful after the skin has healed.  Your infection returns in the same area or another area.  You notice a swollen bump in the infected area.  You develop new symptoms.  You have a fever. SEEK IMMEDIATE MEDICAL CARE IF:   You feel very sleepy.  You develop vomiting or diarrhea.  You have a general ill feeling (malaise) with muscle aches and pains.   This information is not intended to replace advice given to you by your health care provider. Make sure you discuss any questions you have with your health care provider.   Document Released: 07/02/2005 Document Revised: 06/13/2015 Document Reviewed: 12/08/2011 Elsevier Interactive  Patient Education 2016 Else[employer] Inc[employer]  Suture Removal, Care After Refer to this sheet in the next few weeks. These instructions provide you with information on caring for yourself after your procedure. Your health care provider may also give you more specific instructions. Your treatment has been planned according to current medical practices, but problems sometimes occur. Call your health care provider if you have any problems or questions after your procedure. WHAT TO EXPECT AFTER THE PROCEDURE After your stitches (sutures) are removed, it is typical to have the following:  Some discomfort and swelling in the wound area.  Slight redness in the area. HOME CARE INSTRUCTIONS   If you have skin adhesive strips over the wound area, do not take the strips off. They will fall off on their own in a few days. If the strips remain in place after 14 days, you may remove them.  Change any bandages (dressings) at least once a day or as directed by your health care provider. If the bandage sticks, soak it off with warm, soapy water.  Apply cream or ointment only as directed by your health care provider. If using cream or ointment, wash the area with soap and water 2 times a day to remove all the cream or ointment. Rinse off the soap and pat the area dry with a clean towel.  Keep the wound area dry and clean. If the bandage becomes wet or dirty, or if it develops a bad smell, change it as soon as possible.  Continue to protect the wound  from injury.  Use sunscreen when out in the sun. New scars become sunburned easily. SEEK MEDICAL CARE IF:  You have increasing redness, swelling, or pain in the wound.  You see pus coming from the wound.  You have a fever.  You notice a bad smell coming from the wound or dressing.  Your wound breaks open (edges not staying together).   This information is not intended to replace advice given to you by your health care provider. Make sure you discuss any  questions you have with your health care provider.   Document Released: 06/17/2001 Document Revised: 07/13/2013 Document Reviewed: 05/04/2013 Elsevier Interactive Patient Education [employer]

## 2015-12-26 NOTE — ED Notes (Signed)
pt states that 13 days ago he cut his hand and had stitches. States that one of the stitches has come out on its own. States the stitches were supposed to be removed on day 10.

## 2015-12-26 NOTE — ED Provider Notes (Signed)
CSN: [id number]     Arrival date & time 12/26/15  1950 History  By signing my name below, I, Freida Busmaniana Omoyeni, attest that this documentation has been prepared under the direction and in the presence of non-physician practitioner, Cheri FowlerKayla Kavina Cantave, PA-C. Electronically Signed: Freida Busmaniana Omoyeni, Scribe. 12/26/2015. 8:53 PM.  Chief Complaint  Patient presents with  . Hand Injury   The history is provided by the patient. No language interpreter was used.     HPI Comments:  Roger Oconnell is a 40 y.o. male who presents to the Emergency Department to have sutures removed from his right palm. Pt states he lacerated the hand while changing an exacto-blade  ~ 13 days ago and had 4 stitches placed in the ED. He was instructed to have the stitches removed after 10 days but pt did not follow up.  Pt  states today a stitch popped out and pus appeared to be draining from the site. He reports moderate, constant pain to the site.  He denies fever, nausea, vomiting, and tingling of the hand. No alleviating factors noted.   History reviewed. No pertinent past medical history. History reviewed. No pertinent past surgical history. No family history on file. Social History  Substance Use Topics  . Smoking status: Current Every Day Smoker -- 0.00 packs/day  . Smokeless tobacco: None  . Alcohol Use: Yes    Review of Systems  Constitutional: Negative for fever.  Gastrointestinal: Negative for nausea and vomiting.  Skin: Positive for wound.   Allergies  Review of patient's allergies indicates no known allergies.  Home Medications   Prior to Admission medications   Medication Sig Start Date End Date Taking? Authorizing Provider  alprazolam Prudy Feeler(XANAX) 2 MG tablet Take 2 mg by mouth 4 (four) times daily.    Historical Provider, MD  cephALEXin (KEFLEX) 500 MG capsule Take 1 capsule (500 mg total) by mouth 4 (four) times daily. 12/26/15   Cheri FowlerKayla Brach Birdsall, PA-C  clindamycin (CLEOCIN) 150 MG capsule Take 1 capsule (150 mg total)  by mouth every 6 (six) hours. 07/24/15   Elson AreasLeslie K Sofia, PA-C  cyclobenzaprine (FLEXERIL) 10 MG tablet Take 10 mg by mouth 2 (two) times daily as needed. For muscle pain    Historical Provider, MD  HYDROcodone-acetaminophen (NORCO/VICODIN) 5-325 MG tablet Take 2 tablets by mouth every 4 (four) hours as needed. 12/11/15   Hanna Patel-Mills, PA-C  ibuprofen (ADVIL,MOTRIN) 800 MG tablet Take 1 tablet (800 mg total) by mouth 3 (three) times daily. 12/26/15   Cheri FowlerKayla Deliana Avalos, PA-C  methocarbamol (ROBAXIN) 500 MG tablet Take 1 tablet (500 mg total) by mouth every 6 (six) hours as needed for muscle spasms (or pain). 06/30/14   Trixie DredgeEmily West, PA-C  oxyCODONE-acetaminophen (PERCOCET) 10-325 MG tablet Take 1 tablet by mouth every 6 (six) hours as needed for pain. 12/26/15   Cheri FowlerKayla Viveca Beckstrom, PA-C  sertraline (ZOLOFT) 50 MG tablet Take 50 mg by mouth daily.    Historical Provider, MD  sulfamethoxazole-trimethoprim (BACTRIM DS,SEPTRA DS) 800-160 MG tablet Take 1 tablet by mouth 2 (two) times daily. 12/26/15 01/02/16  Cheri FowlerKayla Lucielle Vokes, PA-C  zolpidem (AMBIEN) 10 MG tablet Take 10 mg by mouth at bedtime as needed. For sleep    Historical Provider, MD   BP 129/84 mmHg  Pulse 72  Temp(Src) 98.5 F (36.9 C) (Oral)  Resp 18  SpO2 98% Physical Exam  Constitutional: He is oriented to person, place, and time. He appears well-developed and well-nourished.  HENT:  Head: Normocephalic and atraumatic.  Eyes: Conjunctivae  are normal.  Neck: Normal range of motion.  Cardiovascular:  Capillary refill less than 3 seconds distal to injury.   Pulmonary/Chest: Effort normal. No respiratory distress.  Abdominal: He exhibits no distension.  Musculoskeletal:  Right hand: FROM at MCP joints; healing 3 cm laceration repair with 3 sutures in place with surrounding erythema, tenderness, and purulent drainage Sensation intact  Strength intact  Neurological: He is alert and oriented to person, place, and time.  Strength and sensation intact distal to  injury.  Skin: Skin is warm and dry.  Nursing note and vitals reviewed.   ED Course  Procedures   DIAGNOSTIC STUDIES:  Oxygen Saturation is 98% on RA, normal by my interpretation.    COORDINATION OF CARE:  8:45 PM Discussed treatment plan with pt at bedside and pt agreed to plan.   MDM   Patient to ER for staple/suture removal and wound check. Procedure tolerated well. Pt is afebrile. No tachycardia, hypotension or other symptoms suggestive of severe infection.The region appears to be infected. Pt will be discharged with keflex, bactrim, percocet, and ibuprofen. Pt has a good understanding of return precautions and is safe for discharge at this time.  Final diagnoses:  Visit for suture removal  Cellulitis of right upper extremity   I personally performed the services described in this documentation, which was scribed in my presence. The recorded information has been reviewed and is accurate.   Cheri Fowler, PA-C 12/26/15 2055  Raeford Razor, MD 12/27/15 2217

## 2016-03-24 ENCOUNTER — Encounter (HOSPITAL_COMMUNITY): Payer: Self-pay | Admitting: Emergency Medicine

## 2016-03-24 ENCOUNTER — Emergency Department (HOSPITAL_COMMUNITY)
Admission: EM | Admit: 2016-03-24 | Discharge: 2016-03-24 | Disposition: A | Discharge status: Home / Self Care | Payer: Medicaid Other | Attending: Emergency Medicine | Admitting: Emergency Medicine

## 2016-03-24 DIAGNOSIS — M26622 Arthralgia of left temporomandibular joint: Secondary | ICD-10-CM

## 2016-03-24 DIAGNOSIS — Z79899 Other long term (current) drug therapy: Secondary | ICD-10-CM | POA: Diagnosis not present

## 2016-03-24 DIAGNOSIS — F17200 Nicotine dependence, unspecified, uncomplicated: Secondary | ICD-10-CM | POA: Diagnosis not present

## 2016-03-24 DIAGNOSIS — R6884 Jaw pain: Secondary | ICD-10-CM | POA: Diagnosis present

## 2016-03-24 DIAGNOSIS — K029 Dental caries, unspecified: Secondary | ICD-10-CM | POA: Diagnosis not present

## 2016-03-24 HISTORY — DX: Depression, unspecified: F32.A

## 2016-03-24 HISTORY — DX: Major depressive disorder, single episode, unspecified: F32.9

## 2016-03-24 MED ORDER — NAPROXEN 500 MG PO TABS: 500.0000 mg | ORAL_TABLET | Freq: Two times a day (BID) | ORAL | Status: DC

## 2016-03-24 NOTE — Discharge Instructions (Signed)

## 2016-03-24 NOTE — ED Notes (Signed)
Pain in left upper jaw at TMJ joint-- pt has no teeth in the back on left side, denies toothache-- wears dentures , but has not been wearing them.

## 2016-03-24 NOTE — ED Provider Notes (Signed)
CSN: [id number]     Arrival date & time 03/24/16  1333 History  By signing my name below, I, Jasmyn B. Alexander, attest that this documentation has been prepared under the direction and in the presence of Cheri FowlerKayla Terron Merfeld, PA-C.  Electronically Signed: Gillis EndsJasmyn B. Lyn HollingsheadAlexander, ED Scribe. 03/24/2016. 1:10 AM.   Chief Complaint  Patient presents with  . Jaw Pain    The history is provided by the patient. No language interpreter was used.   HPI Comments: Roger Oconnell is a 40 y.o. male who presents to the Emergency Department complaining of gradual onset, constant, worsening left side jaw pain at the TMJ joint x 3 weeks. Pt reports the pain radiates to his left eye. He states he has not been able to eat solid food except jello due to pain. He notes pain is exacerbated when he opens mouth or wears his dentures. Pt has taken Ibuprofen 1600 mg with no relief with mild relief.  He denies any injury to that area. He has not seen anyone for pain.  Denies any ear drainage, fever, eye drainage, visual disturbance, facial swelling. He reports pain is exacerbated when he opens mouth or any movements.  He states he does not wear his dentures often because they cause pain.   Past Medical History  Diagnosis Date  . Depression    Past Surgical History  Procedure Laterality Date  . Dental restoration/extraction with x-ray     No family history on file. Social History  Substance Use Topics  . Smoking status: Current Every Day Smoker -- 0.00 packs/day  . Smokeless tobacco: None  . Alcohol Use: No    Review of Systems  Constitutional: Negative for fever.  Eyes: Positive for pain. Negative for discharge.  All other systems reviewed and are negative.     Allergies  Review of patient's allergies indicates no known allergies.  Home Medications   Prior to Admission medications   Medication Sig Start Date End Date Taking? Authorizing Provider  alprazolam Prudy Feeler(XANAX) 2 MG tablet Take 2 mg by mouth 4 (four)  times daily.    Historical Provider, MD  cephALEXin (KEFLEX) 500 MG capsule Take 1 capsule (500 mg total) by mouth 4 (four) times daily. 12/26/15   Cheri FowlerKayla Masami Plata, PA-C  clindamycin (CLEOCIN) 150 MG capsule Take 1 capsule (150 mg total) by mouth every 6 (six) hours. 07/24/15   Elson AreasLeslie K Sofia, PA-C  cyclobenzaprine (FLEXERIL) 10 MG tablet Take 10 mg by mouth 2 (two) times daily as needed. For muscle pain    Historical Provider, MD  HYDROcodone-acetaminophen (NORCO/VICODIN) 5-325 MG tablet Take 2 tablets by mouth every 4 (four) hours as needed. 12/11/15   Hanna Patel-Mills, PA-C  ibuprofen (ADVIL,MOTRIN) 800 MG tablet Take 1 tablet (800 mg total) by mouth 3 (three) times daily. 12/26/15   Cheri FowlerKayla Rifka Ramey, PA-C  methocarbamol (ROBAXIN) 500 MG tablet Take 1 tablet (500 mg total) by mouth every 6 (six) hours as needed for muscle spasms (or pain). 06/30/14   Trixie DredgeEmily West, PA-C  naproxen (NAPROSYN) 500 MG tablet Take 1 tablet (500 mg total) by mouth 2 (two) times daily. 03/24/16   Cheri FowlerKayla Trindon Dorton, PA-C  oxyCODONE-acetaminophen (PERCOCET) 10-325 MG tablet Take 1 tablet by mouth every 6 (six) hours as needed for pain. 12/26/15   Cheri FowlerKayla Joss Mcdill, PA-C  sertraline (ZOLOFT) 50 MG tablet Take 50 mg by mouth daily.    Historical Provider, MD  zolpidem (AMBIEN) 10 MG tablet Take 10 mg by mouth at bedtime as needed. For sleep  Historical Provider, MD   BP 123/81 mmHg  Pulse 82  Temp(Src) 99.2 F (37.3 C) (Oral)  Resp 16  Ht  (1.626 m)  Wt 58.968 kg  BMI 22.30 kg/m2 Physical Exam  Constitutional: He is oriented to person, place, and time. He appears well-developed and well-nourished.  Non-toxic appearance. He does not have a sickly appearance. He does not appear ill.  HENT:  Head: Normocephalic and atraumatic.  Right Ear: Tympanic membrane, external ear and ear canal normal.  Left Ear: Tympanic membrane, external ear and ear canal normal.  Nose: Nose normal.  Mouth/Throat: Uvula is midline, oropharynx is clear and moist and  mucous membranes are normal. No trismus in the jaw. Abnormal dentition. Dental caries present.  No tongue swelling or facial swelling.  Airway patent. Patient tolerating secretions without difficulty.  FAROM of TMJ bilaterally, with pain on the left.  No crepitus upon palpation.  Eyes: Conjunctivae are normal.  Neck: Normal range of motion. Neck supple.  No evidence of Ludwigs angina.  Pulmonary/Chest: Effort normal. No accessory muscle usage or stridor. He has no rhonchi.  Abdominal: He exhibits no distension.  Musculoskeletal: Normal range of motion.  Moves all extremities spontaneously.  Lymphadenopathy:    He has no cervical adenopathy.  Neurological: He is alert and oriented to person, place, and time.  Speech clear without dysarthria.  Skin: Skin is warm and dry.  Psychiatric: He has a normal mood and affect. His behavior is normal.    ED Course  Procedures (including critical care time) DIAGNOSTIC STUDIES: Oxygen Saturation is 98% on room air, normal by my interpretation.    COORDINATION OF CARE: 1:10 AM-Discussed treatment plan which includes NSAIDs with pt at bedside and pt agreed to plan.   Labs Review Labs Reviewed - No data to display  Imaging Review No results found. I have personally reviewed and evaluated these images and lab results as part of my medical decision-making.   MDM   Final diagnoses:  Arthralgia of left temporomandibular joint   Patient presents with left-sided jaw pain for the past 3 weeks. No systemic symptoms. VSS, NAD. Patient immediately asks for pain medicine when I enter the room. On exam, patient appears well, nontoxic or septic. Full active range of motion of TMJ, however this does elicit pain on the left side. No trismus. No obvious dental abscess. HENT exam unremarkable. I discussed with the patient that I do not believe narcotic pain medicine is indicated at this time. Discharge home with Naprosyn and dental follow-up. Discussed return  precautions. Patient agrees and acknowledges the above plan for discharge.  I personally performed the services described in this documentation, which was scribed in my presence. The recorded information has been reviewed and is accurate.    Cheri Fowler, PA-C 03/25/16 0113  Cathren Laine, MD 03/25/16 2255

## 2016-03-24 NOTE — ED Notes (Signed)
Declined W/C at D/C and was escorted to lobby by RN. 

## 2018-01-11 ENCOUNTER — Emergency Department (HOSPITAL_COMMUNITY)
Admission: EM | Admit: 2018-01-11 | Discharge: 2018-01-11 | Disposition: A | Discharge status: Home / Self Care | Payer: Medicaid Other | Attending: Emergency Medicine | Admitting: Emergency Medicine

## 2018-01-11 ENCOUNTER — Encounter (HOSPITAL_COMMUNITY): Payer: Self-pay | Admitting: Neurology

## 2018-01-11 DIAGNOSIS — F17210 Nicotine dependence, cigarettes, uncomplicated: Secondary | ICD-10-CM | POA: Diagnosis not present

## 2018-01-11 DIAGNOSIS — L03116 Cellulitis of left lower limb: Secondary | ICD-10-CM | POA: Insufficient documentation

## 2018-01-11 DIAGNOSIS — L0390 Cellulitis, unspecified: Secondary | ICD-10-CM

## 2018-01-11 DIAGNOSIS — Z79899 Other long term (current) drug therapy: Secondary | ICD-10-CM | POA: Insufficient documentation

## 2018-01-11 DIAGNOSIS — M79662 Pain in left lower leg: Secondary | ICD-10-CM | POA: Diagnosis present

## 2018-01-11 MED ORDER — OXYCODONE-ACETAMINOPHEN 5-325 MG PO TABS
1.0000 | ORAL_TABLET | Freq: Once | ORAL | Status: AC
  Administered 2018-01-11: 1 via ORAL
  Filled 2018-01-11: qty 1

## 2018-01-11 MED ORDER — HYDROCODONE-ACETAMINOPHEN 5-325 MG PO TABS
1.0000 | ORAL_TABLET | Freq: Once | ORAL | Status: AC
  Administered 2018-01-11: 1 via ORAL
  Filled 2018-01-11: qty 1

## 2018-01-11 MED ORDER — DOXYCYCLINE HYCLATE 100 MG PO CAPS: 100.0000 mg | ORAL_CAPSULE | Freq: Two times a day (BID) | ORAL | 0 refills | Status: DC

## 2018-01-11 NOTE — ED Triage Notes (Signed)
Pt reports small pimple to left lateral leg 2 days ago, it popped, but has been getting better, redness is spreading. Unsure of any injury or bite. Denies fevers.

## 2018-01-11 NOTE — Discharge Instructions (Addendum)
Please read attached information. If you experience any new or worsening signs or symptoms please return to the emergency room for evaluation. Please follow-up with your primary care provider or specialist as discussed. Please use medication prescribed only as directed and discontinue taking if you have any concerning signs or symptoms.   °

## 2018-01-11 NOTE — ED Notes (Signed)
Pt given Malawiturkey sandwich and sprite per BajaderoJeff PA

## 2018-01-11 NOTE — ED Provider Notes (Signed)
[facility] EMERGENCY DEPARTMENT Provider Note   CSN: [id number] Arrival date & time: 01/11/18  1417   History   Chief Complaint Chief Complaint  Patient presents with  . Leg Pain    HPI Roger Oconnell is a 42 y.o. male.  HPI    42 year old male presents today with complaints of redness and pain to his left lower extremity.  Patient notes 2 days ago with a small pustule that he had attempted to express on his own.  He notes this made symptoms worse with surrounding redness.  He denies any fever chills nausea or vomiting.  No history of HIV, IV drug use or diabetes.  No history of MRSA infections or significant risk factors.  Past Medical History:  Diagnosis Date  . Depression     Patient Active Problem List   Diagnosis Date Noted  . HEADACHE, TENSION 03/16/2008  . TINEA VERSICOLOR 10/08/2007  . TOBACCO ABUSE 10/08/2007  . DENTAL CARIES 10/08/2007  . NECK PAIN 10/08/2007  . LOW BACK PAIN SYNDROME 06/30/2007  . FX CLOSED PHALANX, HAND NOS 01/05/2007    Past Surgical History:  Procedure Laterality Date  . DENTAL RESTORATION/EXTRACTION WITH X-RAY          Home Medications    Prior to Admission medications   Medication Sig Start Date End Date Taking? Authorizing Provider  ALPRAZolam Prudy Feeler) 1 MG tablet Take 1 mg by mouth 3 (three) times daily. 12/10/17  Yes [provider]  amphetamine-dextroamphetamine (ADDERALL) 30 MG tablet Take 1 tablet by mouth 2 (two) times daily. 10/07/17  Yes [provider]  INVEGA SUSTENNA 234 MG/1.5ML SUSP injection Inject 234 mg into the muscle every 30 (thirty) days. 12/10/17  Yes [provider]  LATUDA 80 MG TABS tablet Take 160 mg by mouth daily. 12/10/17  Yes [provider]  promethazine (PHENERGAN) 25 MG tablet Take 25 mg by mouth every 6 (six) hours as needed for nausea/vomiting. 10/13/17  Yes [provider]  doxycycline (VIBRAMYCIN) 100 MG capsule Take 1 capsule (100 mg total)  by mouth 2 (two) times daily. 01/11/18   Eyvonne Mechanic, PA-C    Family History No family history on file.  Social History Social History   Tobacco Use  . Smoking status: Current Every Day Smoker    Packs/day: 0.00  Substance Use Topics  . Alcohol use: No  . Drug use: No     Allergies   Patient has no known allergies.   Review of Systems Review of Systems  All other systems reviewed and are negative.    Physical Exam Updated Vital Signs BP (!) 122/95 (BP Location: Right Arm)   Pulse 82   Temp 98.1 F (36.7 C) (Oral)   Resp 18   Ht 5\' 4"  (1.626 m)   Wt 59 kg (130 lb)   SpO2 100%   BMI 22.31 kg/m   Physical Exam  Constitutional: He is oriented to person, place, and time. He appears well-developed and well-nourished.  HENT:  Head: Normocephalic and atraumatic.  Eyes: Pupils are equal, round, and reactive to light. Conjunctivae are normal. Right eye exhibits no discharge. Left eye exhibits no discharge. No scleral icterus.  Neck: Normal range of motion. No JVD present. No tracheal deviation present.  Pulmonary/Chest: Effort normal. No stridor.  Musculoskeletal:  4 cm area of induration and tenderness surrounded by minor redness, this does not cross into the joint, no purulence noted   Neurological: He is alert and oriented to person, place,  and time. Coordination normal.  Psychiatric: He has a normal mood and affect. His behavior is normal. Judgment and thought content normal.  Nursing note and vitals reviewed.    ED Treatments / Results  Labs (all labs ordered are listed, but only abnormal results are displayed) Labs Reviewed - No data to display  EKG None  Radiology No results found.  Procedures Procedures (including critical care time)  EMERGENCY DEPARTMENT US SOFT TISSUE INTERPRETATION "Study: Limited Soft Tissue Ultrasound"  INDICATIONS: Soft tissue infection Multiple views of the body part were obtained in real-time with a multi-frequency  linear probe  PERFORMED BY: Myself IMAGES ARCHIVED?: Yes SIDE:Left BODY PART:Lower extremity INTERPRETATION:  No abcess noted and Cellulitis present     Medications Ordered in ED Medications  HYDROcodone-acetaminophen (NORCO/VICODIN) 5-325 MG per tablet 1 tablet (1 tablet Oral Given 01/11/18 1600)  oxyCODONE-acetaminophen (PERCOCET/ROXICET) 5-325 MG per tablet 1 tablet (1 tablet Oral Given 01/11/18 2025)     Initial Impression / Assessment and Plan / ED Course  I have reviewed the triage vital signs and the nursing notes.  Pertinent labs & imaging results that were available during my care of the patient were reviewed by me and considered in my medical decision making (see chart for details).      Final Clinical Impressions(s) / ED Diagnoses   Final diagnoses:  Cellulitis, unspecified cellulitis site   Labs:   Imaging: Bedside ultrasound  Consults:  Therapeutics:  Discharge Meds:  doxy  Assessment/Plan: 42 year old male presents today with cellulitis of his lower extremity.  He is well-appearing in no acute distress.  Patient has no comp gating features here no systemic illness.  Bedside ultrasound with no drainable abscess.  Patient started on doxy, encouraged to return if symptoms worsen, follow-up as an outpatient as needed.  Strict return precautions given.  Patient verbalized understanding and agreement to today's plan.      ED Discharge Orders        Ordered    doxycycline (VIBRAMYCIN) 100 MG capsule  2 times daily     01/11/18 1956       Rosalio LoudHedges, Tal Neer, PA-C 01/11/18 2224    Tilden Fossaees, Elizabeth, MD 01/11/18 2303

## 2018-01-11 NOTE — ED Provider Notes (Signed)
Patient placed in Quick Look pathway, seen and evaluated   Chief Complaint: Leg redness, swelling, pain.  HPI:   42 year old male who presents for evaluation of left lower extremity pain, redness, swelling.  States he noticed a small area 2 days ago that he thought was a pimple.  Patient reports a scratch that had some drainage in particular last 2 days, the area has spread.  No fevers.  Reports he is able to walk but reports worsening pain with walking.  No history of diabetes, HIV, IV drug use.   ROS: Leg pain, redness swelling. (one)  Physical Exam:   Gen: No distress  Neuro: Awake and Alert  Skin: Warm    Focused Exam: Left lower extremity with an area 5 x 6 cm area of warmth erythema and induration.  There is some mild central  purulent drainage noted.  Initiation of care has begun. The patient has been counseled on the process, plan, and necessity for staying for the completion/evaluation, and the remainder of the medical screening examination    Rosana HoesLayden, Tenzin Pavon A, PA-C 01/11/18 1552    Jacalyn LefevreHaviland, Julie, MD 01/11/18 [phone]

## 2018-01-11 NOTE — ED Notes (Signed)
ED Provider at bedside. 

## 2020-06-04 ENCOUNTER — Ambulatory Visit (INDEPENDENT_AMBULATORY_CARE_PROVIDER_SITE_OTHER): Payer: Medicaid Other

## 2020-06-04 ENCOUNTER — Other Ambulatory Visit: Payer: Self-pay

## 2020-06-04 DIAGNOSIS — Z23 Encounter for immunization: Secondary | ICD-10-CM

## 2020-06-04 NOTE — Progress Notes (Signed)
   Covid-19 Vaccination Clinic  Name:  Roger Oconnell    MRN: [id number] DOB: [birth year]  06/04/2020  [name] was observed post Covid-19 immunization for 15 minutes without incident. He was provided with Vaccine Information Sheet and instruction to access the V-Safe system.   [name] was instructed to call 911 with any severe reactions post vaccine: Marland Kitchen Difficulty breathing  . Swelling of face and throat  . A fast heartbeat  . A bad rash all over body  . Dizziness and weakness   Immunizations Administered    Name Date Dose VIS Date Route   Pfizer COVID-19 Vaccine 06/04/2020 10:54 AM 0.3 mL 11/30/2018 Intramuscular   Manufacturer: [employer], [employer]   Lot: [id number]   NDC: [address]-2

## 2020-07-09 ENCOUNTER — Other Ambulatory Visit: Payer: Self-pay

## 2020-07-09 ENCOUNTER — Ambulatory Visit (INDEPENDENT_AMBULATORY_CARE_PROVIDER_SITE_OTHER): Payer: Medicaid Other

## 2020-07-09 DIAGNOSIS — Z23 Encounter for immunization: Secondary | ICD-10-CM | POA: Diagnosis not present

## 2020-08-25 ENCOUNTER — Emergency Department (HOSPITAL_COMMUNITY)
Admission: EM | Admit: 2020-08-25 | Discharge: 2020-08-26 | Disposition: A | Discharge status: Home / Self Care | Payer: Medicaid Other | Attending: Emergency Medicine | Admitting: Emergency Medicine

## 2020-08-25 ENCOUNTER — Other Ambulatory Visit: Payer: Self-pay

## 2020-08-25 ENCOUNTER — Encounter (HOSPITAL_COMMUNITY): Payer: Self-pay | Admitting: *Deleted

## 2020-08-25 DIAGNOSIS — M545 Low back pain, unspecified: Secondary | ICD-10-CM | POA: Diagnosis present

## 2020-08-25 DIAGNOSIS — F17200 Nicotine dependence, unspecified, uncomplicated: Secondary | ICD-10-CM | POA: Diagnosis not present

## 2020-08-25 NOTE — ED Triage Notes (Signed)
THE PT IS C/O NECK AND BACK PAIN  WHILE PICKING SOMETHING UP  2 DAYS AGO HE CHECKED IN BECAUSE HIS WIFE CHECKED IN AS A PT TO SIT WITH HER

## 2020-08-26 MED ORDER — CYCLOBENZAPRINE HCL 10 MG PO TABS: 10.0000 mg | ORAL_TABLET | Freq: Two times a day (BID) | ORAL | 0 refills | Status: DC | PRN

## 2020-08-26 MED ORDER — MELOXICAM 7.5 MG PO TABS: 7.5000 mg | ORAL_TABLET | Freq: Every day | ORAL | 0 refills | Status: AC

## 2020-08-26 NOTE — ED Provider Notes (Signed)
[facility] EMERGENCY DEPARTMENT Provider Note   CSN: [id number] Arrival date & time: 08/25/20  2203     History Chief Complaint  Patient presents with  . Back Pain    Roger Oconnell is a 44 y.o. male.   Back Pain Pain location: lumbar paraspinal. Quality:  Aching Radiates to:  Does not radiate Pain severity:  Mild Duration:  2 days Timing:  Intermittent Progression:  Waxing and waning Chronicity:  Recurrent Context: occupational injury   Relieved by:  Ibuprofen Ineffective treatments:  Heating pad Associated symptoms: no abdominal pain   Risk factors: no hx of cancer        Past Medical History:  Diagnosis Date  . Depression     Patient Active Problem List   Diagnosis Date Noted  . HEADACHE, TENSION 03/16/2008  . TINEA VERSICOLOR 10/08/2007  . TOBACCO ABUSE 10/08/2007  . DENTAL CARIES 10/08/2007  . NECK PAIN 10/08/2007  . LOW BACK PAIN SYNDROME 06/30/2007  . FX CLOSED PHALANX, HAND NOS 01/05/2007    Past Surgical History:  Procedure Laterality Date  . DENTAL RESTORATION/EXTRACTION WITH X-RAY         No family history on file.  Social History   Tobacco Use  . Smoking status: Current Every Day Smoker    Packs/day: 0.00  . Smokeless tobacco: Never Used  Substance Use Topics  . Alcohol use: No  . Drug use: No    Home Medications Prior to Admission medications   Medication Sig Start Date End Date Taking? Authorizing Provider  ALPRAZolam Prudy Feeler) 1 MG tablet Take 1 mg by mouth 3 (three) times daily. 12/10/17   [provider]  amphetamine-dextroamphetamine (ADDERALL) 30 MG tablet Take 1 tablet by mouth 2 (two) times daily. 10/07/17   [provider]  cyclobenzaprine (FLEXERIL) 10 MG tablet Take 1 tablet (10 mg total) by mouth 2 (two) times daily as needed for muscle spasms. 08/26/20   Tatem Holsonback, Barbara Cower, MD  doxycycline (VIBRAMYCIN) 100 MG capsule Take 1 capsule (100 mg total) by mouth 2 (two) times daily. 01/11/18    Hedges, Tinnie Gens, PA-C  INVEGA SUSTENNA 234 MG/1.5ML SUSP injection Inject 234 mg into the muscle every 30 (thirty) days. 12/10/17   [provider]  LATUDA 80 MG TABS tablet Take 160 mg by mouth daily. 12/10/17   [provider]  meloxicam (MOBIC) 7.5 MG tablet Take 1 tablet (7.5 mg total) by mouth daily for 14 days. 08/26/20 09/09/20  Danee Soller, Barbara Cower, MD  promethazine (PHENERGAN) 25 MG tablet Take 25 mg by mouth every 6 (six) hours as needed for nausea/vomiting. 10/13/17   [provider]    Allergies    Patient has no known allergies.  Review of Systems   Review of Systems  Gastrointestinal: Negative for abdominal pain.  Musculoskeletal: Positive for back pain.  All other systems reviewed and are negative.   Physical Exam Updated Vital Signs BP 126/86   Pulse 90   Temp 98.2 F (36.8 C) (Oral)   Resp 16   Ht 5\' 4"  (1.626 m)   Wt 61.2 kg   SpO2 99%   BMI 23.17 kg/m   Physical Exam Vitals and nursing note reviewed.  Constitutional:      Appearance: He is well-developed.  HENT:     Head: Normocephalic and atraumatic.  Eyes:     Pupils: Pupils are equal, round, and reactive to light.  Cardiovascular:     Rate and Rhythm: Normal rate.  Pulmonary:  Effort: Pulmonary effort is normal. No respiratory distress.  Abdominal:     General: There is no distension.  Musculoskeletal:        General: Tenderness (bilateral lumbar paraspinal) present. No signs of injury. Normal range of motion.     Cervical back: Normal range of motion.  Skin:    General: Skin is warm and dry.     Coloration: Skin is not jaundiced or pale.  Neurological:     General: No focal deficit present.     Mental Status: He is alert.     ED Results / Procedures / Treatments   Labs (all labs ordered are listed, but only abnormal results are displayed) Labs Reviewed - No data to display  EKG None  Radiology No results found.  Procedures Procedures (including critical care  time)  Medications Ordered in ED Medications - No data to display  ED Course  I have reviewed the triage vital signs and the nursing notes.  Pertinent labs & imaging results that were available during my care of the patient were reviewed by me and considered in my medical decision making (see chart for details).    MDM Rules/Calculators/A&P                          No red flags for imaging. Will treat symptomatically.   Final Clinical Impression(s) / ED Diagnoses Final diagnoses:  Acute left-sided low back pain without sciatica    Rx / DC Orders ED Discharge Orders         Ordered    cyclobenzaprine (FLEXERIL) 10 MG tablet  2 times daily PRN        08/26/20 0314    meloxicam (MOBIC) 7.5 MG tablet  Daily        08/26/20 0314           Atreyu Mak, Barbara Cower, MD 08/26/20 2315

## 2020-09-01 ENCOUNTER — Emergency Department (HOSPITAL_COMMUNITY)
Admission: EM | Admit: 2020-09-01 | Discharge: 2020-09-01 | Payer: Medicaid Other | Attending: Emergency Medicine | Admitting: Emergency Medicine

## 2020-09-01 ENCOUNTER — Emergency Department (HOSPITAL_COMMUNITY): Payer: Medicaid Other

## 2020-09-01 ENCOUNTER — Encounter (HOSPITAL_COMMUNITY): Payer: Self-pay | Admitting: Emergency Medicine

## 2020-09-01 ENCOUNTER — Other Ambulatory Visit: Payer: Self-pay

## 2020-09-01 DIAGNOSIS — S68121A Partial traumatic metacarpophalangeal amputation of left index finger, initial encounter: Secondary | ICD-10-CM | POA: Insufficient documentation

## 2020-09-01 DIAGNOSIS — R000 Tachycardia, unspecified: Secondary | ICD-10-CM | POA: Insufficient documentation

## 2020-09-01 DIAGNOSIS — F17200 Nicotine dependence, unspecified, uncomplicated: Secondary | ICD-10-CM | POA: Insufficient documentation

## 2020-09-01 DIAGNOSIS — Z20822 Contact with and (suspected) exposure to covid-19: Secondary | ICD-10-CM | POA: Diagnosis not present

## 2020-09-01 DIAGNOSIS — W312XXA Contact with powered woodworking and forming machines, initial encounter: Secondary | ICD-10-CM | POA: Insufficient documentation

## 2020-09-01 DIAGNOSIS — S68119A Complete traumatic metacarpophalangeal amputation of unspecified finger, initial encounter: Secondary | ICD-10-CM

## 2020-09-01 DIAGNOSIS — Z23 Encounter for immunization: Secondary | ICD-10-CM | POA: Diagnosis not present

## 2020-09-01 DIAGNOSIS — R109 Unspecified abdominal pain: Secondary | ICD-10-CM | POA: Insufficient documentation

## 2020-09-01 DIAGNOSIS — S6992XA Unspecified injury of left wrist, hand and finger(s), initial encounter: Secondary | ICD-10-CM | POA: Diagnosis present

## 2020-09-01 LAB — RESP PANEL BY RT-PCR (FLU A&B, COVID) ARPGX2
Influenza A by PCR: NEGATIVE
Influenza B by PCR: NEGATIVE
SARS Coronavirus 2 by RT PCR: NEGATIVE

## 2020-09-01 MED ORDER — ONDANSETRON HCL 4 MG/2ML IJ SOLN
4.0000 mg | Freq: Once | INTRAMUSCULAR | Status: AC
  Administered 2020-09-01: 15:00:00 4 mg via INTRAVENOUS
  Filled 2020-09-01: qty 2

## 2020-09-01 MED ORDER — TETANUS-DIPHTH-ACELL PERTUSSIS 5-2.5-18.5 LF-MCG/0.5 IM SUSY
0.5000 mL | PREFILLED_SYRINGE | Freq: Once | INTRAMUSCULAR | Status: AC
  Administered 2020-09-01: 15:00:00 0.5 mL via INTRAMUSCULAR
  Filled 2020-09-01: qty 0.5

## 2020-09-01 MED ORDER — CEFAZOLIN SODIUM-DEXTROSE 2-4 GM/100ML-% IV SOLN
2.0000 g | Freq: Once | INTRAVENOUS | Status: AC
  Administered 2020-09-01: 15:00:00 2 g via INTRAVENOUS
  Filled 2020-09-01: qty 100

## 2020-09-01 MED ORDER — FENTANYL CITRATE (PF) 100 MCG/2ML IJ SOLN
100.0000 ug | Freq: Once | INTRAMUSCULAR | Status: DC
  Filled 2020-09-01: qty 2

## 2020-09-01 MED ORDER — OXYCODONE-ACETAMINOPHEN 5-325 MG PO TABS
1.0000 | ORAL_TABLET | Freq: Once | ORAL | Status: AC
  Administered 2020-09-01: 16:00:00 1 via ORAL
  Filled 2020-09-01: qty 1
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
## 2020-09-01 NOTE — ED Notes (Signed)
Pt refused fentanyl, requesting percocet, EDP made aware

## 2020-09-01 NOTE — Discharge Instructions (Signed)
You will need to go directly to [facility] health.  You can enter through the emergency room doors.  You will need to tell the dentist that you are seeing Dr. Hardie Pulley with hand surgery to repair your finger.  Do not stop along the way and do not eat anything after leaving the ER here.

## 2020-09-01 NOTE — ED Notes (Signed)
Bandage reinforced iv removed pain med given  They are going to [facility] to see a hand surgeon being driven ny a friend of theirs
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
## 2020-09-10 ENCOUNTER — Ambulatory Visit: Payer: Medicaid Other | Admitting: Orthopedic Surgery

## 2020-10-13 ENCOUNTER — Other Ambulatory Visit: Payer: Self-pay

## 2021-01-09 ENCOUNTER — Emergency Department (HOSPITAL_COMMUNITY)
Admission: EM | Admit: 2021-01-09 | Discharge: 2021-01-09 | Disposition: A | Discharge status: Home / Self Care | Payer: Medicaid Other | Attending: Emergency Medicine | Admitting: Emergency Medicine

## 2021-01-09 DIAGNOSIS — X110XXA Contact with hot water in bath or tub, initial encounter: Secondary | ICD-10-CM | POA: Insufficient documentation

## 2021-01-09 DIAGNOSIS — Z79899 Other long term (current) drug therapy: Secondary | ICD-10-CM | POA: Diagnosis not present

## 2021-01-09 DIAGNOSIS — F17200 Nicotine dependence, unspecified, uncomplicated: Secondary | ICD-10-CM | POA: Diagnosis not present

## 2021-01-09 DIAGNOSIS — T23202A Burn of second degree of left hand, unspecified site, initial encounter: Secondary | ICD-10-CM | POA: Insufficient documentation

## 2021-01-09 DIAGNOSIS — Y9389 Activity, other specified: Secondary | ICD-10-CM | POA: Diagnosis not present

## 2021-01-09 DIAGNOSIS — T23232A Burn of second degree of multiple left fingers (nail), not including thumb, initial encounter: Secondary | ICD-10-CM | POA: Insufficient documentation

## 2021-01-09 DIAGNOSIS — Z23 Encounter for immunization: Secondary | ICD-10-CM | POA: Insufficient documentation

## 2021-01-09 DIAGNOSIS — T23002A Burn of unspecified degree of left hand, unspecified site, initial encounter: Secondary | ICD-10-CM | POA: Diagnosis present

## 2021-01-09 MED ORDER — TETANUS-DIPHTH-ACELL PERTUSSIS 5-2.5-18.5 LF-MCG/0.5 IM SUSY
0.5000 mL | PREFILLED_SYRINGE | Freq: Once | INTRAMUSCULAR | Status: AC
  Administered 2021-01-09: 0.5 mL via INTRAMUSCULAR
  Filled 2021-01-09: qty 0.5

## 2021-01-09 MED ORDER — CEPHALEXIN 250 MG PO CAPS
500.0000 mg | ORAL_CAPSULE | Freq: Once | ORAL | Status: AC
  Administered 2021-01-09: 500 mg via ORAL
  Filled 2021-01-09: qty 2

## 2021-01-09 MED ORDER — IBUPROFEN 600 MG PO TABS: 600.0000 mg | ORAL_TABLET | Freq: Four times a day (QID) | ORAL | 0 refills | Status: DC | PRN

## 2021-01-09 MED ORDER — LIDOCAINE HCL 2 % IJ SOLN
5.0000 mL | Freq: Once | INTRAMUSCULAR | Status: AC
Start: 2021-01-09 — End: 2021-01-09
  Administered 2021-01-09: 100 mg

## 2021-01-09 MED ORDER — OXYCODONE-ACETAMINOPHEN 5-325 MG PO TABS
1.0000 | ORAL_TABLET | Freq: Once | ORAL | Status: AC
  Administered 2021-01-09: 1 via ORAL
  Filled 2021-01-09: qty 1

## 2021-01-09 MED ORDER — CEPHALEXIN 500 MG PO CAPS: 500.0000 mg | ORAL_CAPSULE | Freq: Three times a day (TID) | ORAL | 0 refills | Status: AC

## 2021-01-09 NOTE — Discharge Instructions (Addendum)
Keep your wound clean and covered.  It is important you gently cleanse her wound daily with soap and water, apply thin layer of antibiotic ointment, and rebandage with a clean bandage. Take the antibiotic as directed until gone. Return for concerns for infection. Follow-up with the plastic surgeon to ensure proper wound healing.

## 2021-01-09 NOTE — ED Notes (Signed)
Called pt 3x for vitals. No response
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
## 2021-03-09 ENCOUNTER — Ambulatory Visit (INDEPENDENT_AMBULATORY_CARE_PROVIDER_SITE_OTHER): Payer: Medicaid Other

## 2021-03-09 ENCOUNTER — Other Ambulatory Visit: Payer: Self-pay

## 2021-03-09 DIAGNOSIS — Z23 Encounter for immunization: Secondary | ICD-10-CM | POA: Diagnosis not present

## 2021-03-09 NOTE — Progress Notes (Signed)
   Covid-19 Vaccination Clinic  Name:  Navi Erber    MRN: [id number] DOB: 24-Sep-1976  03/09/2021  Mr. Navarra was observed post Covid-19 immunization for 15 MINUTES without incident. He was provided with Vaccine Information Sheet and instruction to access the V-Safe system.   Mr. Carruthers was instructed to call 911 with any severe reactions post vaccine: Marland Kitchen Difficulty breathing  . Swelling of face and throat  . A fast heartbeat  . A bad rash all over body  . Dizziness and weakness   Immunizations Administered    Name Date Dose VIS Date Route   PFIZER Comrnaty(Gray TOP) Covid-19 Vaccine 03/09/2021 11:26 AM 0.3 mL 09/13/2020 Intramuscular   Manufacturer: [employer], [employer]   Lot: [id number]   NDC: [phone]

## 2021-03-18 ENCOUNTER — Emergency Department (HOSPITAL_COMMUNITY): Payer: Medicaid Other

## 2021-03-18 ENCOUNTER — Emergency Department (HOSPITAL_COMMUNITY)
Admission: EM | Admit: 2021-03-18 | Discharge: 2021-03-18 | Disposition: A | Discharge status: Home / Self Care | Payer: Medicaid Other | Attending: Emergency Medicine | Admitting: Emergency Medicine

## 2021-03-18 ENCOUNTER — Encounter (HOSPITAL_COMMUNITY): Payer: Self-pay

## 2021-03-18 ENCOUNTER — Other Ambulatory Visit: Payer: Self-pay

## 2021-03-18 DIAGNOSIS — R4701 Aphasia: Secondary | ICD-10-CM | POA: Insufficient documentation

## 2021-03-18 DIAGNOSIS — K029 Dental caries, unspecified: Secondary | ICD-10-CM | POA: Diagnosis not present

## 2021-03-18 DIAGNOSIS — R825 Elevated urine levels of drugs, medicaments and biological substances: Secondary | ICD-10-CM | POA: Insufficient documentation

## 2021-03-18 DIAGNOSIS — R270 Ataxia, unspecified: Secondary | ICD-10-CM | POA: Insufficient documentation

## 2021-03-18 DIAGNOSIS — R4781 Slurred speech: Secondary | ICD-10-CM | POA: Diagnosis not present

## 2021-03-18 DIAGNOSIS — Z20822 Contact with and (suspected) exposure to covid-19: Secondary | ICD-10-CM | POA: Insufficient documentation

## 2021-03-18 DIAGNOSIS — Y900 Blood alcohol level of less than 20 mg/100 ml: Secondary | ICD-10-CM | POA: Diagnosis not present

## 2021-03-18 DIAGNOSIS — F17210 Nicotine dependence, cigarettes, uncomplicated: Secondary | ICD-10-CM | POA: Insufficient documentation

## 2021-03-18 LAB — I-STAT CHEM 8, ED
BUN: 10 mg/dL (ref 6–20)
Calcium, Ion: 0.89 mmol/L — CL (ref 1.15–1.40)
Chloride: 106 mmol/L (ref 98–111)
Creatinine, Ser: 1 mg/dL (ref 0.61–1.24)
Glucose, Bld: 135 mg/dL — ABNORMAL HIGH (ref 70–99)
HCT: 35 % — ABNORMAL LOW (ref 39.0–52.0)
Hemoglobin: 11.9 g/dL — ABNORMAL LOW (ref 13.0–17.0)
Potassium: 3.6 mmol/L (ref 3.5–5.1)
Sodium: 137 mmol/L (ref 135–145)
TCO2: 24 mmol/L (ref 22–32)

## 2021-03-18 LAB — URINALYSIS, ROUTINE W REFLEX MICROSCOPIC
Bilirubin Urine: NEGATIVE
Glucose, UA: NEGATIVE mg/dL
Hgb urine dipstick: NEGATIVE
Ketones, ur: NEGATIVE mg/dL
Leukocytes,Ua: NEGATIVE
Nitrite: NEGATIVE
Protein, ur: NEGATIVE mg/dL
Specific Gravity, Urine: 1.012 (ref 1.005–1.030)
pH: 5 (ref 5.0–8.0)

## 2021-03-18 LAB — RESP PANEL BY RT-PCR (FLU A&B, COVID) ARPGX2
Influenza A by PCR: NEGATIVE
Influenza B by PCR: NEGATIVE
SARS Coronavirus 2 by RT PCR: NEGATIVE

## 2021-03-18 LAB — DIFFERENTIAL
Abs Immature Granulocytes: 0.05 10*3/uL (ref 0.00–0.07)
Basophils Absolute: 0.1 10*3/uL (ref 0.0–0.1)
Basophils Relative: 1 %
Eosinophils Absolute: 0.1 10*3/uL (ref 0.0–0.5)
Eosinophils Relative: 1 %
Immature Granulocytes: 1 %
Lymphocytes Relative: 13 %
Lymphs Abs: 1 10*3/uL (ref 0.7–4.0)
Monocytes Absolute: 0.9 10*3/uL (ref 0.1–1.0)
Monocytes Relative: 11 %
Neutro Abs: 6.2 10*3/uL (ref 1.7–7.7)
Neutrophils Relative %: 73 %

## 2021-03-18 LAB — RAPID URINE DRUG SCREEN, HOSP PERFORMED
Amphetamines: POSITIVE — AB
Barbiturates: NOT DETECTED
Benzodiazepines: NOT DETECTED
Cocaine: NOT DETECTED
Opiates: NOT DETECTED
Tetrahydrocannabinol: POSITIVE — AB

## 2021-03-18 LAB — I-STAT VENOUS BLOOD GAS, ED
Acid-Base Excess: 1 mmol/L (ref 0.0–2.0)
Bicarbonate: 26 mmol/L (ref 20.0–28.0)
Calcium, Ion: 1.15 mmol/L (ref 1.15–1.40)
HCT: 36 % — ABNORMAL LOW (ref 39.0–52.0)
Hemoglobin: 12.2 g/dL — ABNORMAL LOW (ref 13.0–17.0)
O2 Saturation: 86 %
Potassium: 3.5 mmol/L (ref 3.5–5.1)
Sodium: 140 mmol/L (ref 135–145)
TCO2: 27 mmol/L (ref 22–32)
pCO2, Ven: 43.3 mmHg — ABNORMAL LOW (ref 44.0–60.0)
pH, Ven: 7.386 (ref 7.250–7.430)
pO2, Ven: 52 mmHg — ABNORMAL HIGH (ref 32.0–45.0)

## 2021-03-18 LAB — COMPREHENSIVE METABOLIC PANEL
ALT: 18 U/L (ref 0–44)
AST: 27 U/L (ref 15–41)
Albumin: 3.4 g/dL — ABNORMAL LOW (ref 3.5–5.0)
Alkaline Phosphatase: 69 U/L (ref 38–126)
Anion gap: 8 (ref 5–15)
BUN: 10 mg/dL (ref 6–20)
CO2: 24 mmol/L (ref 22–32)
Calcium: 8.4 mg/dL — ABNORMAL LOW (ref 8.9–10.3)
Chloride: 105 mmol/L (ref 98–111)
Creatinine, Ser: 1.13 mg/dL (ref 0.61–1.24)
GFR, Estimated: >60 mL/min (ref >60)
Glucose, Bld: 130 mg/dL — ABNORMAL HIGH (ref 70–99)
Potassium: 3.8 mmol/L (ref 3.5–5.1)
Sodium: 137 mmol/L (ref 135–145)
Total Bilirubin: 0.3 mg/dL (ref 0.3–1.2)
Total Protein: 5.8 g/dL — ABNORMAL LOW (ref 6.5–8.1)

## 2021-03-18 LAB — CBC
HCT: 38.1 % — ABNORMAL LOW (ref 39.0–52.0)
Hemoglobin: 12.3 g/dL — ABNORMAL LOW (ref 13.0–17.0)
MCH: 28.4 pg (ref 26.0–34.0)
MCHC: 32.3 g/dL (ref 30.0–36.0)
MCV: 88 fL (ref 80.0–100.0)
Platelets: 248 10*3/uL (ref 150–400)
RBC: 4.33 MIL/uL (ref 4.22–5.81)
RDW: 14.8 % (ref 11.5–15.5)
WBC: 8.4 10*3/uL (ref 4.0–10.5)
nRBC: 0 % (ref 0.0–0.2)

## 2021-03-18 LAB — ETHANOL: Alcohol, Ethyl (B): <10 mg/dL (ref <10)

## 2021-03-18 LAB — LACTIC ACID, PLASMA: Lactic Acid, Venous: 1.4 mmol/L (ref 0.5–1.9)

## 2021-03-18 LAB — PROTIME-INR
INR: 1 (ref 0.8–1.2)
Prothrombin Time: 13.2 seconds (ref 11.4–15.2)

## 2021-03-18 LAB — AMMONIA: Ammonia: 34 umol/L (ref 9–35)

## 2021-03-18 LAB — APTT: aPTT: 28 seconds (ref 24–36)

## 2021-03-18 MED ORDER — IOHEXOL 350 MG/ML SOLN
50.0000 mL | Freq: Once | INTRAVENOUS | Status: AC | PRN
  Administered 2021-03-18: 50 mL via INTRAVENOUS

## 2021-03-18 MED ORDER — AMOXICILLIN 500 MG PO CAPS
500.0000 mg | ORAL_CAPSULE | Freq: Once | ORAL | Status: AC
  Administered 2021-03-18: 500 mg via ORAL
  Filled 2021-03-18: qty 1

## 2021-03-18 MED ORDER — AMOXICILLIN 500 MG PO CAPS: 500.0000 mg | ORAL_CAPSULE | Freq: Three times a day (TID) | ORAL | 0 refills | Status: DC

## 2021-03-18 MED ORDER — OXYCODONE-ACETAMINOPHEN 5-325 MG PO TABS
1.0000 | ORAL_TABLET | Freq: Once | ORAL | Status: AC
  Administered 2021-03-18: 1 via ORAL
  Filled 2021-03-18: qty 1

## 2021-03-18 MED ORDER — SODIUM CHLORIDE 0.9 % IV BOLUS
1000.0000 mL | Freq: Once | INTRAVENOUS | Status: AC
  Administered 2021-03-18: 1000 mL via INTRAVENOUS

## 2021-03-18 NOTE — ED Provider Notes (Addendum)
  Physical Exam  BP 122/89   Pulse 65   Temp 98.1 F (36.7 C) (Oral)   Resp 18   Ht 5\' 4"  (1.626 m)   Wt 63.5 kg   SpO2 97%   BMI 24.03 kg/m   Physical Exam Vitals and nursing note reviewed.  Constitutional:      Appearance: Normal appearance.  HENT:     Head: Normocephalic and atraumatic.     Mouth/Throat:      Comments: Dental decay with swollen, erythematous gums Pulmonary:     Effort: Pulmonary effort is normal. No respiratory distress.  Neurological:     Mental Status: He is alert and oriented to person, place, and time.     Cranial Nerves: Cranial nerves are intact. No dysarthria.     Sensory: Sensation is intact. No sensory deficit.     Motor: No weakness or tremor.     Comments: Slow finger nose testing and rapid alternating movements    ED Course/Procedures     Procedures  MDM  MRI negative. Will discharge home with close neuro f/u recommended. He is now asymptomatic.  Patient also complaining of dental decay and will be given amoxicillin.  He was advised to follow-up with dentistry.      , MD 03/18/21 03/20/21    Lynelle Smoke, MD 03/18/21 1900

## 2021-03-18 NOTE — ED Triage Notes (Signed)
Pt BIB [facility] from home c/o of stroke like symptoms. Pt went to bed last night and was normal woke up at 7am with slurred speech, unsteady gait. Pt states he took an adderall to help with the symptoms. Pt denies any pain at this time.

## 2021-03-18 NOTE — ED Provider Notes (Signed)
[facility] EMERGENCY DEPARTMENT Provider Note   CSN: [id number] Arrival date & time: 03/18/21  1210     History Chief Complaint  Patient presents with   Aphasia    Roger Oconnell is a 45 y.o. male.  Roger Oconnell is a 45 y.o. male with a history of depression, otherwise healthy, who presents to the ED via EMS for evaluation of possible strokelike symptoms.  Patient reports when he went to bed last night around 10 PM. He was in his usual state of health, woke up at 7 AM this morning and noted that he was having some slurred speech and unsteady gait. He has never had similar symptoms.  He reports he took Adderall and Xanax which he is prescribed chronically at home to see if it would help with the symptoms and then went to work.  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  They also noted he was walking with an unsteady gait and so sent him home.  Once he arrived home his wife called EMS.  He reports he thinks his slurred speech has improved some and he still feels a little unsteady.  He denies any associated vision changes, facial droop, numbness or weakness.  Denies using any alcohol or drugs.  Does report previous marijuana use and taking his prescribed Xanax this morning.  He denies any associated chest pain or shortness of breath.  No fevers or chills.  No cough or congestion. No sick contacts. No abdominal pain, nausea or vomiting.       The history is provided by the patient.    Past Medical History:  Diagnosis Date   Depression     Patient Active Problem List   Diagnosis Date Noted   HEADACHE, TENSION 03/16/2008   TINEA VERSICOLOR 10/08/2007   TOBACCO ABUSE 10/08/2007   DENTAL CARIES 10/08/2007   NECK PAIN 10/08/2007   LOW BACK PAIN SYNDROME 06/30/2007   FX CLOSED PHALANX, HAND NOS 01/05/2007    Past  Surgical History:  Procedure Laterality Date   DENTAL RESTORATION/EXTRACTION WITH X-RAY         History reviewed. No pertinent family history.  Social History   Tobacco Use   Smoking status: Every Day    Packs/day: 0.00    Pack years: 0.00    Types: Cigarettes   Smokeless tobacco: Never  Substance Use Topics   Alcohol use: No   Drug use: No    Home Medications Prior to Admission medications   Medication Sig Start Date End Date Taking? Authorizing Provider  ALPRAZolam Prudy Feeler) 1 MG tablet Take 1 mg by mouth 3 (three) times daily as needed for anxiety. 12/10/17  Yes [provider]  amoxicillin (AMOXIL) 500 MG capsule Take 1 capsule (500 mg total) by mouth 3 (three) times daily. 03/18/21  Yes Koleen Distance, MD  amphetamine-dextroamphetamine (ADDERALL) 30 MG tablet Take 1 tablet by mouth 2 (two) times daily. 10/07/17  Yes [provider]  FLUoxetine (PROZAC) 40 MG capsule Take 80 mg by mouth every morning. 03/06/21  Yes [provider]  gabapentin (NEURONTIN) 600 MG tablet Take 600 mg by mouth 3 (three) times daily. 03/07/21  Yes [provider]  ibuprofen (ADVIL) 600 MG tablet Take 1 tablet (600 mg total) by mouth every 6 (six) hours as needed. 01/09/21  Yes  Robinson, [place] N, PA-C  INVEGA SUSTENNA 234 MG/1.5ML SUSP injection Inject 234 mg into the muscle every 30 (thirty) days. 12/10/17  Yes [provider]  lamoTRIgine (LAMICTAL) 150 MG tablet Take 300 mg by mouth daily. 03/06/21  Yes [provider]  omeprazole (PRILOSEC) 20 MG capsule Take 20 mg by mouth every morning. 03/15/21  Yes [provider]  QUEtiapine (SEROQUEL) 200 MG tablet Take 200 mg by mouth daily. 03/06/21  Yes [provider]  Vitamin D, Ergocalciferol, (DRISDOL) 1.25 MG (50000 UNIT) CAPS capsule Take 50,000 Units by mouth once a week. Sunday 03/06/21  Yes [provider]  cyclobenzaprine (FLEXERIL) 10 MG tablet Take 1 tablet (10 mg total) by mouth 2  (two) times daily as needed for muscle spasms. Patient not taking: Reported on 03/18/2021 08/26/20   Mesner, Barbara CowerJason, MD    Allergies    Patient has no known allergies.  Review of Systems   Review of Systems  Constitutional:  Negative for chills and fever.  HENT:  Positive for dental problem.   Eyes:  Negative for visual disturbance.  Respiratory:  Negative for cough and shortness of breath.   Cardiovascular:  Negative for chest pain.  Gastrointestinal:  Negative for abdominal pain, nausea and vomiting.  Genitourinary:  Negative for dysuria and frequency.  Musculoskeletal:  Negative for arthralgias and myalgias.  Skin:  Negative for color change and rash.  Neurological:  Positive for dizziness and speech difficulty. Negative for seizures, syncope, facial asymmetry, weakness, light-headedness, numbness and headaches.  All other systems reviewed and are negative.  Physical Exam Updated Vital Signs BP 97/62   Pulse 85   Temp 98.1 F (36.7 C) (Oral)   Resp 14   Ht 5\' 4"  (1.626 m)   Wt 63.5 kg   SpO2 90%   BMI 24.03 kg/m   Physical Exam Vitals and nursing note reviewed.  Constitutional:      General: He is not in acute distress.    Appearance: Normal appearance. He is well-developed and normal weight. He is not ill-appearing or diaphoretic.  HENT:     Head: Normocephalic and atraumatic.     Mouth/Throat:     Mouth: Mucous membranes are moist.     Pharynx: Oropharynx is clear.  Eyes:     General:        Right eye: No discharge.        Left eye: No discharge.     Extraocular Movements: Extraocular movements intact.     Pupils: Pupils are equal, round, and reactive to light.     Comments: No nystagmus noted  Cardiovascular:     Rate and Rhythm: Normal rate and regular rhythm.     Pulses: Normal pulses.     Heart sounds: Normal heart sounds. No murmur heard.   No friction rub. No gallop.  Pulmonary:     Effort: Pulmonary effort is normal. No respiratory distress.      Breath sounds: Normal breath sounds. No wheezing or rales.     Comments: Respirations equal and unlabored, patient able to speak in full sentences, Sats 90-91% on room air, improved on 2L [place], lungs clear to auscultation bilaterally  Abdominal:     General: Bowel sounds are normal. There is no distension.     Palpations: Abdomen is soft. There is no mass.     Tenderness: There is no abdominal tenderness. There is no guarding.     Comments: Abdomen soft, nondistended, nontender to palpation in all quadrants without  guarding or peritoneal signs  Musculoskeletal:        General: No deformity.     Cervical back: Neck supple.  Skin:    General: Skin is warm and dry.     Capillary Refill: Capillary refill takes less than 2 seconds.  Neurological:     Mental Status: He is alert and oriented to person, place, and time.     Coordination: Coordination normal.     Comments: Speech is clear, able to follow commands CN III-XII intact Normal strength in upper and lower extremities bilaterally including dorsiflexion and plantar flexion, strong and equal grip strength Sensation normal to light and sharp touch When he holds out his hands he seemed to have some ataxia Normal finger to nose and rapid alternating movements, although patient is very slow to do this No pronator drift  Psychiatric:        Mood and Affect: Mood normal.        Behavior: Behavior normal.    ED Results / Procedures / Treatments   Labs (all labs ordered are listed, but only abnormal results are displayed) Labs Reviewed  CBC - Abnormal; Notable for the following components:      Result Value   Hemoglobin 12.3 (*)    HCT 38.1 (*)    All other components within normal limits  COMPREHENSIVE METABOLIC PANEL - Abnormal; Notable for the following components:   Glucose, Bld 130 (*)    Calcium 8.4 (*)    Total Protein 5.8 (*)    Albumin 3.4 (*)    All other components within normal limits  RAPID URINE DRUG SCREEN, HOSP  PERFORMED - Abnormal; Notable for the following components:   Amphetamines POSITIVE (*)    Tetrahydrocannabinol POSITIVE (*)    All other components within normal limits  I-STAT CHEM 8, ED - Abnormal; Notable for the following components:   Glucose, Bld 135 (*)    Calcium, Ion 0.89 (*)    Hemoglobin 11.9 (*)    HCT 35.0 (*)    All other components within normal limits  I-STAT VENOUS BLOOD GAS, ED - Abnormal; Notable for the following components:   pCO2, Ven 43.3 (*)    pO2, Ven 52.0 (*)    HCT 36.0 (*)    Hemoglobin 12.2 (*)    All other components within normal limits  RESP PANEL BY RT-PCR (FLU A&B, COVID) ARPGX2  ETHANOL  PROTIME-INR  APTT  DIFFERENTIAL  URINALYSIS, ROUTINE W REFLEX MICROSCOPIC  LACTIC ACID, PLASMA  AMMONIA  BLOOD GAS, VENOUS    EKG EKG Interpretation  Date/Time:  Monday March 18 2021 12:29:40 EDT Ventricular Rate:  83 PR Interval:  165 QRS Duration: 91 QT Interval:  365 QTC Calculation: 429 R Axis:   73 Text Interpretation: Sinus rhythm ST elev, probable normal early repol pattern No old tracing to compare Confirmed by Mancel Bale [phone]) on 03/18/2021 12:55:15 PM  Radiology CT HEAD WO CONTRAST  Result Date: 03/18/2021 CLINICAL DATA:  Slurred speech and gait disturbance EXAM: CT HEAD WITHOUT CONTRAST TECHNIQUE: Contiguous axial images were obtained from the base of the skull through the vertex without intravenous contrast. COMPARISON:  None. FINDINGS: Brain: Ventricles and sulci are normal in size and configuration. There is no intracranial mass, hemorrhage, extra-axial fluid collection, or midline shift. The brain parenchyma appears unremarkable. No acute infarct appreciable. Vascular: No hyperdense vessel.  No evident vascular calcification. Skull: The bony calvarium appears intact. Sinuses/Orbits: There is opacification in a posterior right ethmoid air cell.  There is slight mucosal thickening in several ethmoid air cells. Other visualized paranasal  sinuses are clear. Visualized orbits appear symmetric bilaterally. Other: Mastoid air cells are clear. IMPRESSION: Foci of paranasal sinus disease.  Study otherwise unremarkable. Electronically Signed   By: Bretta Bang III M.D.   On: 03/18/2021 13:57   CT Angio Chest PE W and/or Wo Contrast  Result Date: 03/18/2021 CLINICAL DATA:  Pulmonary embolus suspected with high probability. Stroke-like symptoms. Patient woke up at 7 a.m. today with slurred speech and unsteady gait. EXAM: CT ANGIOGRAPHY CHEST WITH CONTRAST TECHNIQUE: Multidetector CT imaging of the chest was performed using the standard protocol during bolus administration of intravenous contrast. Multiplanar CT image reconstructions and MIPs were obtained to evaluate the vascular anatomy. CONTRAST:  69mL OMNIPAQUE IOHEXOL 350 MG/ML SOLN COMPARISON:  Chest radiograph 03/18/2021 FINDINGS: Cardiovascular: There is good opacification of the central and segmental pulmonary arteries. No focal filling defects. No evidence of significant pulmonary embolus. Normal heart size. No pericardial effusions. Normal caliber thoracic aorta. No aortic dissection. Great vessel origins are patent. Mediastinum/Nodes: No enlarged mediastinal, hilar, or axillary lymph nodes. Thyroid gland, trachea, and esophagus demonstrate no significant findings. Lungs/Pleura: Mild atelectasis in the lung bases. No pleural effusions or pneumothorax. Upper Abdomen: No acute abnormalities demonstrated in the visualized upper abdomen. Musculoskeletal: No chest wall abnormality. No acute or significant osseous findings. Review of the MIP images confirms the above findings. IMPRESSION: No evidence of significant pulmonary embolus. Mild atelectasis in the lung bases. Electronically Signed   By: Burman Nieves M.D.   On: 03/18/2021 18:09   DG Chest Portable 1 View  Result Date: 03/18/2021 CLINICAL DATA:  Hypoxia, aphasia. EXAM: PORTABLE CHEST 1 VIEW COMPARISON:  None. FINDINGS: Trachea is  midline. Heart size is accentuated by AP semi upright technique and low lung volumes. No airspace consolidation or pleural fluid. IMPRESSION: No acute findings. Electronically Signed   By: Leanna Battles M.D.   On: 03/18/2021 13:03    Procedures Procedures   Medications Ordered in ED Medications  oxyCODONE-acetaminophen (PERCOCET/ROXICET) 5-325 MG per tablet 1 tablet (has no administration in time range)  amoxicillin (AMOXIL) capsule 500 mg (has no administration in time range)  sodium chloride 0.9 % bolus 1,000 mL (0 mLs Intravenous Stopped 03/18/21 1559)  iohexol (OMNIPAQUE) 350 MG/ML injection 50 mL (50 mLs Intravenous Contrast Given 03/18/21 1750)    ED Course  I have reviewed the triage vital signs and the nursing notes.  Pertinent labs & imaging results that were available during my care of the patient were reviewed by me and considered in my medical decision making (see chart for details).    MDM Rules/Calculators/A&P                         45 year old male presents to the ED via EMS for evaluation of slurred speech and ataxia.  Last known normal 10 PM last night when he went to bed, woke up with symptoms at 7 AM.  Feels that they have improved somewhat since he woke up but have not completely resolved.  Denies associated headache.  No current focal neurologic deficits noted on exam but patient is unsteady with gait.  On arrival patient also noted to have blood pressure of 97/62 and O2 sats of 90-91%.  He is afebrile.  Denies any shortness of breath, cough, chest pain or fever.  Unclear etiology for hypoxia and borderline hypotension.  Patient does not appear septic.  Despite reported  slurred speech he is alert and oriented, speech is currently clear and he has no focal neurologic deficits on exam.  Will initiate broad work-up.  Patient also reports taking Xanax this morning, question whether he may have taken more than his prescribed dose and this could be contributed to blood pressure  and low sats.  Will need imaging to rule out stroke as well.  I have independently ordered, reviewed and interpreted all labs and imaging:  CBC: No leukocytosis, stable hemoglobin CMP: Glucose 130, calcium 8.4, no other significant electrolyte derangements, normal renal and liver function Coags: WNL Ethanol: WNL UDS: Positive for amphetamines which patient is prescribed, and THC  Blood gas without evidence of hypoxia or hypercapnia.  Lactic acid: WNL Ammonia: WNL  Chest x-ray: No acute abnormalities  CT head: no acute intracranial abnormalities.  On reevaluation gait has improved somewhat.  Unclear etiology for symptoms at this time.  Will need MRI to rule out stroke.  Given unexplained hypoxia will also get CTA of the chest and check troponin.  CTA unremarkable.  Vitals have now improved and patient is on room air, blood pressure improved to systolic in the 120s.  High clinical suspicion this may have been medication related.  At shift change MRI is pending, if negative and patient is able to ambulate with stable gait with no hypoxia he can be discharged home with outpatient neurology follow-up.  Care signed out to Dr. Pieter Partridge who will follow up on MRI results and reevaluate patient.  Final Clinical Impression(s) / ED Diagnoses Final diagnoses:  Ataxia  Dental caries    Rx / DC Orders ED Discharge Orders          Ordered    amoxicillin (AMOXIL) 500 MG capsule  3 times daily        03/18/21 1857             Legrand Rams 03/18/21 1910    Koleen Distance, MD 03/18/21 2045

## 2021-06-19 ENCOUNTER — Encounter: Payer: Self-pay | Admitting: Neurology

## 2021-06-19 ENCOUNTER — Ambulatory Visit: Payer: Medicaid Other | Admitting: Diagnostic Neuroimaging

## 2021-11-13 IMAGING — MR MR HEAD W/O CM
7 of 11 series · 25 of 48 positions shown · non-contrast
Comparison: Head CT 03/18/2021

CLINICAL DATA: Dizziness

EXAM:
MRI HEAD WITHOUT CONTRAST
TECHNIQUE: Multiplanar, multiecho pulse sequences of the brain and surrounding
structures were obtained without intravenous contrast.

[Series 2: DWI · axial · 3.0mm · 0.94mm/px · z∈[-147,-8]mm · 7 of 103 slices shown (1 of 2)]
[im 1/103]
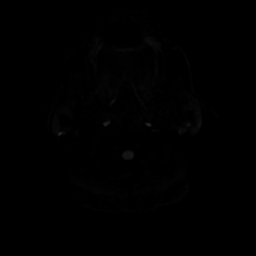
[im 18/103]
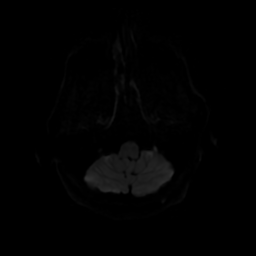
[im 35/103]
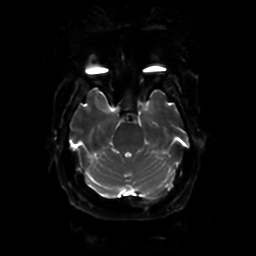
[im 52/103]
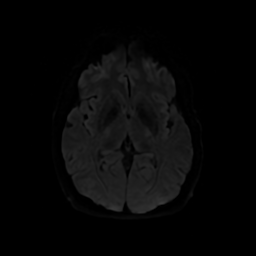
[im 69/103]
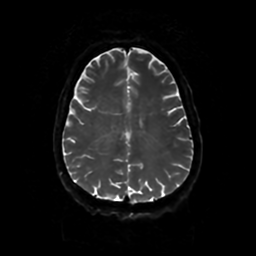
[im 86/103]
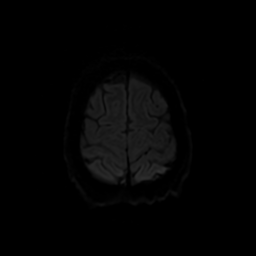
[im 103/103]
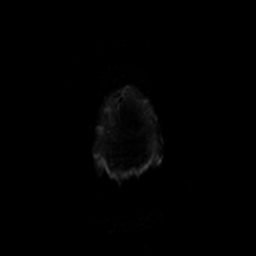

[Series 3: DWI · coronal · 4.0mm · 0.94mm/px · 5 of 74 slices shown (2 of 2)]
[im 1/74]
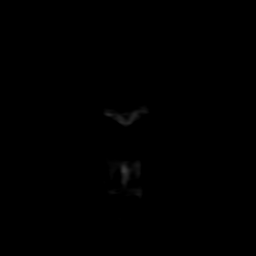
[im 19/74]
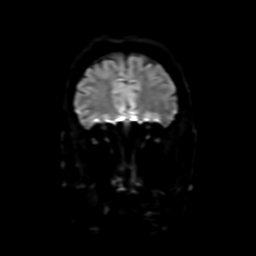
[im 37/74]
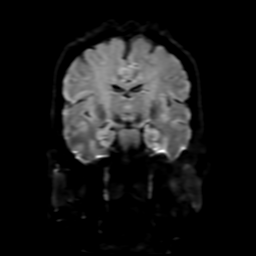
[im 55/74]
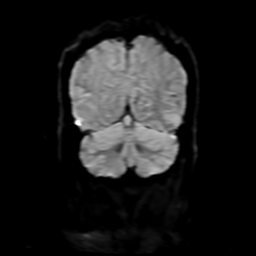
[im 74/74]
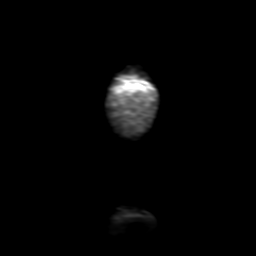

[Series 4: FLAIR · sagittal · 5.0mm · 0.23mm/px · 2 of 23 slices shown (1 of 2)]
[im 1/23]
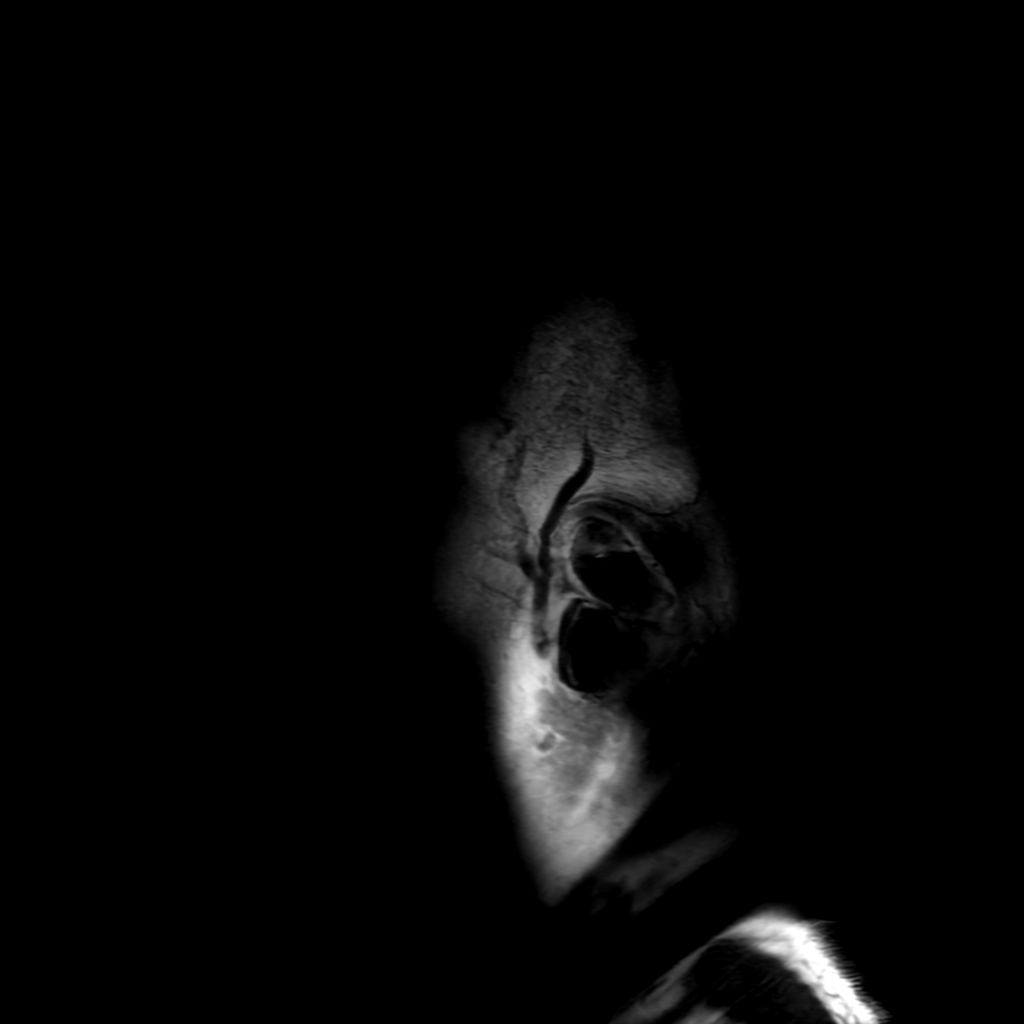
[im 23/23]
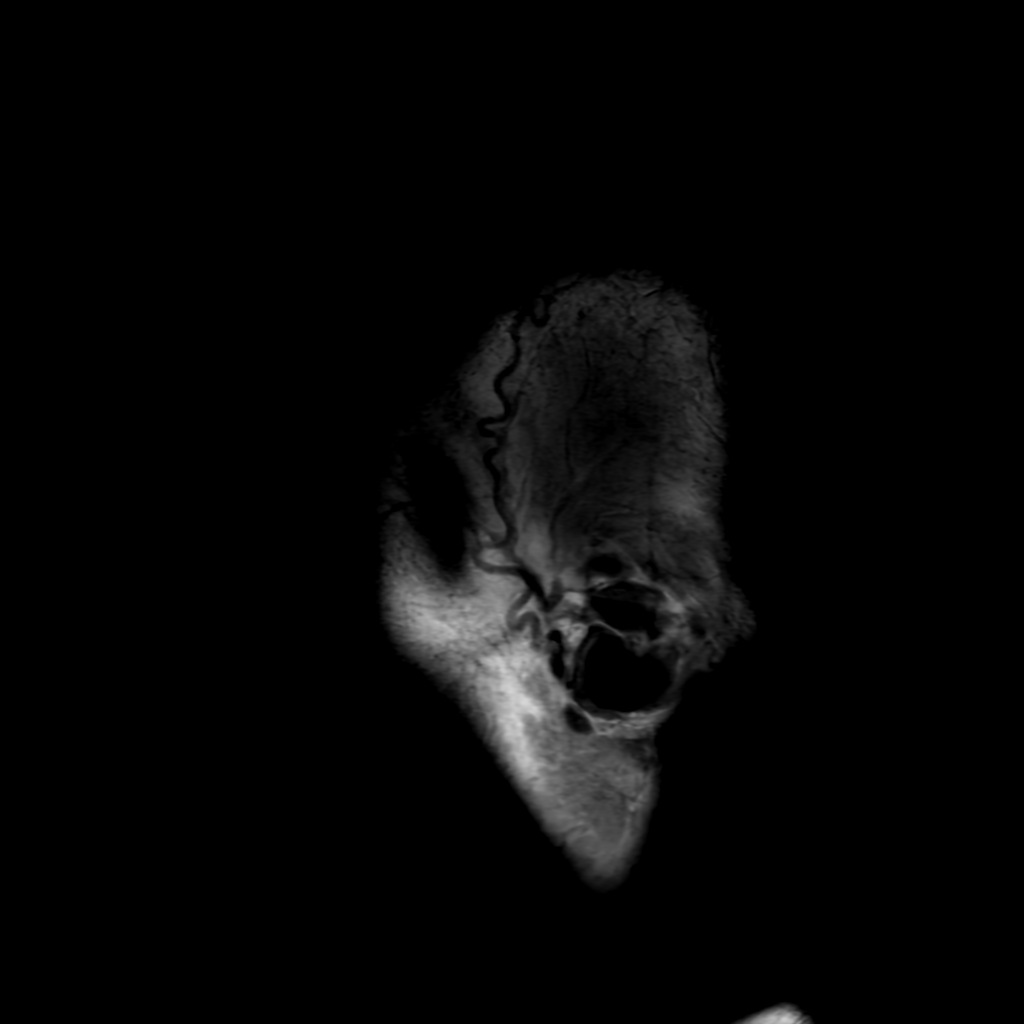

[Series 5: T2 · axial · 5.0mm · 0.23mm/px · 1 of 26 slices shown]
[im 1/26]
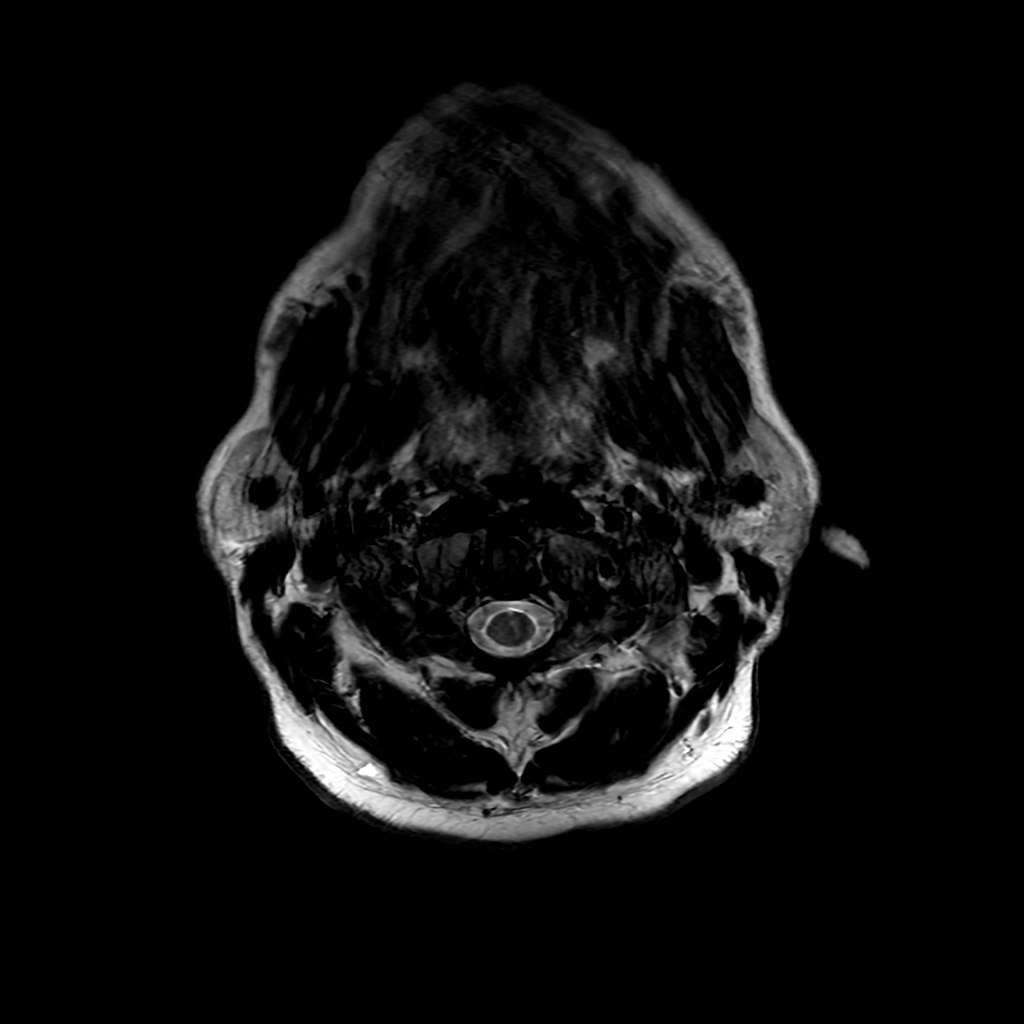

[Series 6: FLAIR · axial · 4.0mm · 0.47mm/px · z∈[-147,-8]mm · 3 of 35 slices shown (2 of 2)]
[im 1/35]
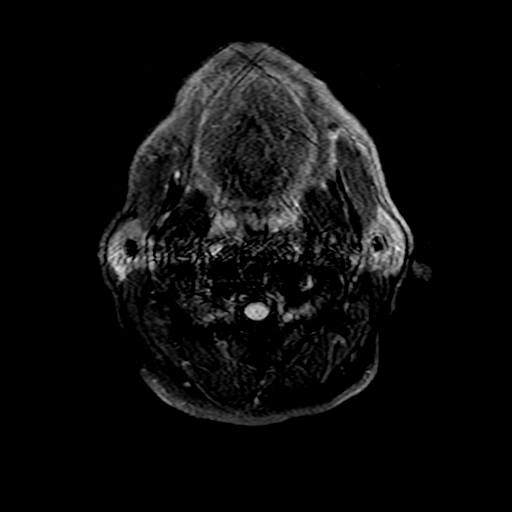
[im 18/35]
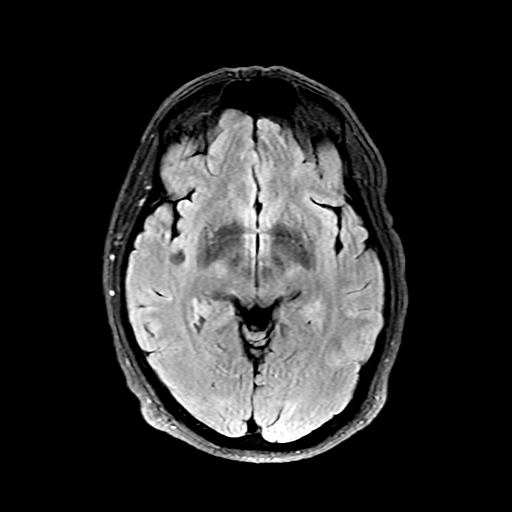
[im 35/35]
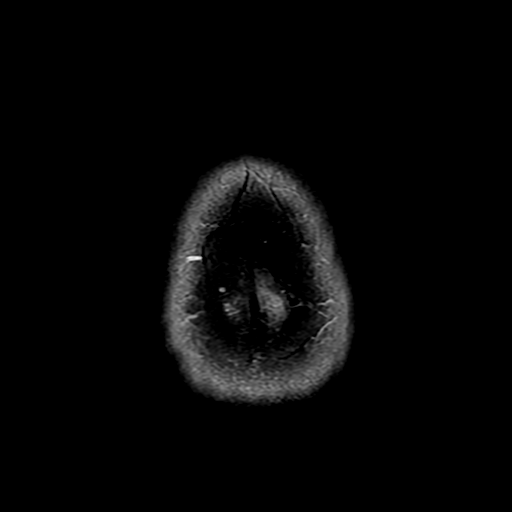

[Series 250: ADC · axial · 3.0mm · 0.94mm/px · z∈[-147,-8]mm · 4 of 52 slices shown (1 of 2)]
[im 1/52]
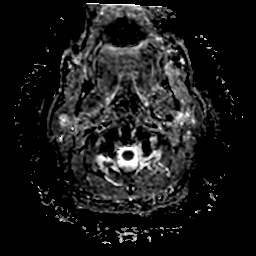
[im 18/52]
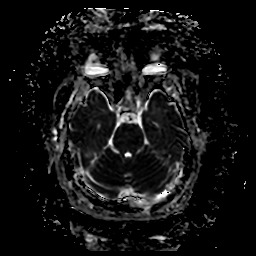
[im 35/52]
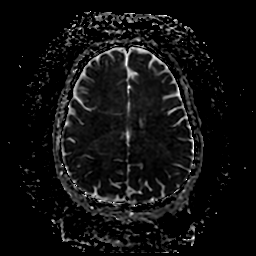
[im 52/52]
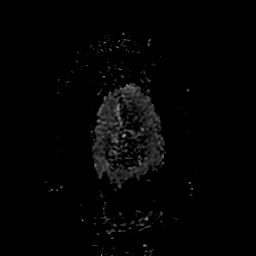

[Series 350: ADC · coronal · 4.0mm · 0.94mm/px · 3 of 36 slices shown (2 of 2)]
[im 1/36]
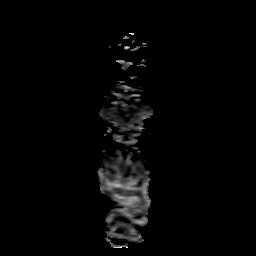
[im 18/36]
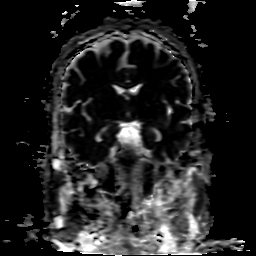
[im 36/36]
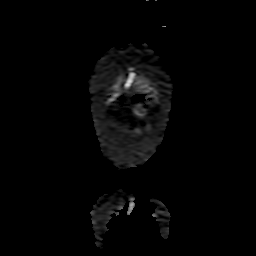

[25 of 48 positions shown; findings below may reference images not displayed]

FINDINGS: Brain: No acute infarct, mass effect or extra-axial collection. No
acute or chronic hemorrhage. Normal white matter signal, parenchymal
volume and CSF spaces. The midline structures are normal.

Vascular: Major flow voids are preserved.

Skull and upper cervical spine: Normal calvarium and skull base.
Visualized upper cervical spine and soft tissues are normal.

Sinuses/Orbits:No paranasal sinus fluid levels or advanced mucosal
thickening. No mastoid or middle ear effusion. Normal orbits.
IMPRESSION: Normal brain MRI.

## 2022-06-09 ENCOUNTER — Emergency Department (HOSPITAL_COMMUNITY)
Admission: EM | Admit: 2022-06-09 | Discharge: 2022-06-09 | Disposition: A | Discharge status: Home / Self Care | Payer: Medicaid Other | Attending: Emergency Medicine | Admitting: Emergency Medicine

## 2022-06-09 DIAGNOSIS — U071 COVID-19: Secondary | ICD-10-CM | POA: Insufficient documentation

## 2022-06-09 DIAGNOSIS — Z20822 Contact with and (suspected) exposure to covid-19: Secondary | ICD-10-CM

## 2022-06-09 LAB — SARS CORONAVIRUS 2 BY RT PCR: SARS Coronavirus 2 by RT PCR: POSITIVE — AB

## 2022-06-09 NOTE — Discharge Instructions (Signed)
You are tested today for COVID-19.  Please check MyChart for the results.  Follow-up with your primary if you develop symptoms.  Isolate at home.

## 2022-06-09 NOTE — ED Triage Notes (Signed)
Pt exposed to covid through covid+ son. No symptoms, requesting test.

## 2022-06-09 NOTE — ED Provider Notes (Signed)
[facility] EMERGENCY DEPARTMENT Provider Note   CSN: [id number] Arrival date & time: 06/09/22  1747     History  Chief Complaint  Patient presents with   Covid Exposure    Roger Oconnell is a 46 y.o. male.  HPI   Patient presents due to COVID exposure.  He is entirely asymptomatic but his son tested positive for COVID recently and he wants to get tested.  Denies chest pain, shortness of breath, difficulty breathing.   Home Medications Prior to Admission medications   Medication Sig Start Date End Date Taking? Authorizing Provider  ALPRAZolam Prudy Feeler) 1 MG tablet Take 1 mg by mouth 3 (three) times daily as needed for anxiety. 12/10/17   [provider]  amoxicillin (AMOXIL) 500 MG capsule Take 1 capsule (500 mg total) by mouth 3 (three) times daily. 03/18/21   Koleen Distance, MD  amphetamine-dextroamphetamine (ADDERALL) 30 MG tablet Take 1 tablet by mouth 2 (two) times daily. 10/07/17   [provider]  cyclobenzaprine (FLEXERIL) 10 MG tablet Take 1 tablet (10 mg total) by mouth 2 (two) times daily as needed for muscle spasms. Patient not taking: Reported on 03/18/2021 08/26/20   Mesner, Barbara Cower, MD  FLUoxetine (PROZAC) 40 MG capsule Take 80 mg by mouth every morning. 03/06/21   [provider]  gabapentin (NEURONTIN) 600 MG tablet Take 600 mg by mouth 3 (three) times daily. 03/07/21   [provider]  ibuprofen (ADVIL) 600 MG tablet Take 1 tablet (600 mg total) by mouth every 6 (six) hours as needed. 01/09/21   Robinson, [place] N, PA-C  INVEGA SUSTENNA 234 MG/1.5ML SUSP injection Inject 234 mg into the muscle every 30 (thirty) days. 12/10/17   [provider]  lamoTRIgine (LAMICTAL) 150 MG tablet Take 300 mg by mouth daily. 03/06/21   [provider]  omeprazole (PRILOSEC) 20 MG capsule Take 20 mg by mouth every morning. 03/15/21   [provider]  QUEtiapine (SEROQUEL) 200 MG tablet Take 200 mg by mouth daily. 03/06/21    [provider]  Vitamin D, Ergocalciferol, (DRISDOL) 1.25 MG (50000 UNIT) CAPS capsule Take 50,000 Units by mouth once a week. Sunday 03/06/21   [provider]      Allergies    Patient has no known allergies.    Review of Systems   Review of Systems  Physical Exam Updated Vital Signs BP 98/81 (BP Location: Right Arm)   Pulse 89   Temp 99.6 F (37.6 C) (Oral)   Resp 16   SpO2 96%  Physical Exam Vitals and nursing note reviewed. Exam conducted with a chaperone present.  Constitutional:      Appearance: Normal appearance.  HENT:     Head: Normocephalic and atraumatic.  Eyes:     General: No scleral icterus.       Right eye: No discharge.        Left eye: No discharge.     Extraocular Movements: Extraocular movements intact.     Pupils: Pupils are equal, round, and reactive to light.  Cardiovascular:     Rate and Rhythm: Normal rate and regular rhythm.     Pulses: Normal pulses.     Heart sounds: Normal heart sounds. No murmur heard.    No friction rub. No gallop.  Pulmonary:     Effort: Pulmonary effort is normal. No respiratory distress.     Breath sounds: Normal breath sounds.  Abdominal:     General: Abdomen is flat. Bowel sounds  are normal. There is no distension.     Palpations: Abdomen is soft.     Tenderness: There is no abdominal tenderness.  Skin:    General: Skin is warm and dry.     Coloration: Skin is not jaundiced.  Neurological:     Mental Status: He is alert. Mental status is at baseline.     Coordination: Coordination normal.     ED Results / Procedures / Treatments   Labs (all labs ordered are listed, but only abnormal results are displayed) Labs Reviewed  SARS CORONAVIRUS 2 BY RT PCR    EKG None  Radiology No results found.  Procedures Procedures    Medications Ordered in ED Medications - No data to display  ED Course/ Medical Decision Making/ A&P                           Medical Decision Making  Patient  presents due to COVID-19 exposure from son.  They are entirely asymptomatic, physical exam is unremarkable and nonrevealing.  COVID swab obtained, they will check MyChart for the result.  Considered additional work-up but given asymptomatic do not think indicated.        Final Clinical Impression(s) / ED Diagnoses Final diagnoses:  Exposure to COVID-19 virus    Rx / DC Orders ED Discharge Orders     None         Theron Arista, PA-C 06/09/22 1842    Glyn Ade, MD 06/09/22 2039

## 2022-06-09 NOTE — ED Notes (Signed)
Patient verbalizes understanding of discharge instructions. Opportunity for questioning and answers were provided. Pt discharged from ED stable & ambulatory.   

## 2022-06-10 ENCOUNTER — Telehealth (HOSPITAL_COMMUNITY): Payer: Self-pay

## 2022-07-22 ENCOUNTER — Encounter (HOSPITAL_COMMUNITY): Payer: Self-pay | Admitting: Emergency Medicine

## 2022-07-22 ENCOUNTER — Ambulatory Visit (HOSPITAL_COMMUNITY)
Admission: EM | Admit: 2022-07-22 | Discharge: 2022-07-22 | Disposition: A | Discharge status: Home / Self Care | Payer: Medicaid Other | Attending: Emergency Medicine | Admitting: Emergency Medicine

## 2022-07-22 DIAGNOSIS — T148XXA Other injury of unspecified body region, initial encounter: Secondary | ICD-10-CM

## 2022-07-22 DIAGNOSIS — M62830 Muscle spasm of back: Secondary | ICD-10-CM | POA: Diagnosis not present

## 2022-07-22 MED ORDER — METHOCARBAMOL 500 MG PO TABS: 500.0000 mg | ORAL_TABLET | Freq: Every evening | ORAL | 0 refills | Status: DC

## 2022-07-22 MED ORDER — KETOROLAC TROMETHAMINE 30 MG/ML IJ SOLN
30.0000 mg | Freq: Once | INTRAMUSCULAR | Status: AC
  Administered 2022-07-22: 30 mg via INTRAMUSCULAR

## 2022-07-22 MED ORDER — KETOROLAC TROMETHAMINE 30 MG/ML IJ SOLN
INTRAMUSCULAR | Status: AC
  Filled 2022-07-22: qty 1

## 2022-07-22 NOTE — ED Provider Notes (Signed)
[facility]    CSN: [id number] Arrival date & time: 07/22/22  0847      History   Chief Complaint Chief Complaint  Patient presents with   Back Pain    HPI Roger Oconnell is a 46 y.o. male.  Patient complaining of right lower back pain that started 2 days ago.  Patient states onset of symptoms began after lifting his roommate from a wheelchair.  Patient states he now has a sharp pain in his lower back that is worsened by bending.  He denies any radiation of pain down his leg .Patient denies any change in bowel or bladder .Patient has not taken any medications for symptoms.  Patient reports a history of having a pinched nerve in his back.  Patient denies any previous surgery to the lower back.    Back Pain Associated symptoms: no dysuria and no numbness     Past Medical History:  Diagnosis Date   Depression     Patient Active Problem List   Diagnosis Date Noted   HEADACHE, TENSION 03/16/2008   TINEA VERSICOLOR 10/08/2007   TOBACCO ABUSE 10/08/2007   DENTAL CARIES 10/08/2007   NECK PAIN 10/08/2007   LOW BACK PAIN SYNDROME 06/30/2007   FX CLOSED PHALANX, HAND NOS 01/05/2007    Past Surgical History:  Procedure Laterality Date   DENTAL RESTORATION/EXTRACTION WITH X-RAY         Home Medications    Prior to Admission medications   Medication Sig Start Date End Date Taking? Authorizing Provider  methocarbamol (ROBAXIN) 500 MG tablet Take 1 tablet (500 mg total) by mouth at bedtime. 07/22/22  Yes Debby Freiberg, NP  ALPRAZolam Prudy Feeler) 1 MG tablet Take 1 mg by mouth 3 (three) times daily as needed for anxiety. 12/10/17   [provider]  amoxicillin (AMOXIL) 500 MG capsule Take 1 capsule (500 mg total) by mouth 3 (three) times daily. 03/18/21   Koleen Distance, MD  amphetamine-dextroamphetamine (ADDERALL) 30 MG tablet Take 1 tablet by mouth 2 (two) times daily. 10/07/17   [provider]  FLUoxetine (PROZAC) 40 MG capsule Take 80 mg by  mouth every morning. 03/06/21   [provider]  gabapentin (NEURONTIN) 600 MG tablet Take 600 mg by mouth 3 (three) times daily. 03/07/21   [provider]  ibuprofen (ADVIL) 600 MG tablet Take 1 tablet (600 mg total) by mouth every 6 (six) hours as needed. 01/09/21   Robinson, [place] N, PA-C  INVEGA SUSTENNA 234 MG/1.5ML SUSP injection Inject 234 mg into the muscle every 30 (thirty) days. 12/10/17   [provider]  lamoTRIgine (LAMICTAL) 150 MG tablet Take 300 mg by mouth daily. 03/06/21   [provider]  omeprazole (PRILOSEC) 20 MG capsule Take 20 mg by mouth every morning. 03/15/21   [provider]  QUEtiapine (SEROQUEL) 200 MG tablet Take 200 mg by mouth daily. 03/06/21   [provider]  Vitamin D, Ergocalciferol, (DRISDOL) 1.25 MG (50000 UNIT) CAPS capsule Take 50,000 Units by mouth once a week. Sunday 03/06/21   [provider]    Family History History reviewed. No pertinent family history.  Social History Social History   Tobacco Use   Smoking status: Every Day    Packs/day: 0.00    Types: Cigarettes   Smokeless tobacco: Never  Substance Use Topics   Alcohol use: No   Drug use: No     Allergies   Patient has no known allergies.   Review of Systems  Review of Systems  Constitutional:  Negative for activity change.  Genitourinary:  Negative for dysuria and flank pain.  Musculoskeletal:  Positive for back pain (RT sided lower back).  Neurological:  Negative for numbness.     Physical Exam Triage Vital Signs ED Triage Vitals  Enc Vitals Group     BP 07/22/22 0914 120/81     Pulse Rate 07/22/22 0914 74     Resp 07/22/22 0914 17     Temp 07/22/22 0914 97.9 F (36.6 C)     Temp Source 07/22/22 0914 Oral     SpO2 07/22/22 0914 100 %     Weight --      Height --      Head Circumference --      Peak Flow --      Pain Score 07/22/22 0913 9     Pain Loc --      Pain Edu? --      Excl. in [address]? --    No data  found.  Updated Vital Signs BP 120/81 (BP Location: Right Arm)   Pulse 74   Temp 97.9 F (36.6 C) (Oral)   Resp 17   SpO2 100%      Physical Exam Vitals and nursing note reviewed.  Musculoskeletal:     Cervical back: Normal.     Thoracic back: Normal.     Lumbar back: Spasms and tenderness present. No swelling, edema or deformity. Positive right straight leg raise test.  Neurological:     Mental Status: He is alert.      UC Treatments / Results  Labs (all labs ordered are listed, but only abnormal results are displayed) Labs Reviewed - No data to display  EKG   Radiology No results found.  Procedures Procedures (including critical care time)  Medications Ordered in UC Medications  ketorolac (TORADOL) 30 MG/ML injection 30 mg (30 mg Intramuscular Given 07/22/22 0936)    Initial Impression / Assessment and Plan / UC Course  I have reviewed the triage vital signs and the nursing notes.  Pertinent labs & imaging results that were available during my care of the patient were reviewed by me and considered in my medical decision making (see chart for details).     Patient was treated for muscle strain and spasm of the right lower back.  Patient was given a Toradol injection in office.  Robaxin was sent to the pharmacy and patient was educated on safety precautions with this medication, patient was educated to use this medication at night.  Patient was advised to rest his back for the rest of the week to aid with decreasing the inflammation in the muscle.  Patient requested "painkillers".  This Probation officer educated patient on using nonsteroidal anti-inflammatory drugs to aid with decreasing inflammation.  Patient declined any NSAIDs stating he is scared to get stomach ulcers.  Patient was educated that this medication could be used for a couple days and taken with food to prevent possible stomach ulcer.  Patient still declined the use of this medication. Patient was given  instruction on using Tylenol for pain management.   Patient verbalized understanding of instructions.  Final Clinical Impressions(s) / UC Diagnoses   Final diagnoses:  Muscle strain  Muscle spasm of back     Discharge Instructions      Robaxin was sent to the pharmacy, may take this medication at night to help relax the muscle.  This medication can make you sleepy, please do not operate a  car or heavy machinery after taking this medication.   As discussed, nonsteroidal anti-inflammatory drugs like Aleve or naproxen can be used darting tomorrow for the next couple days, but per our conversation if you would not like to use these medications, you can use Tylenol over-the-counter for pain relief, you can take Tylenol every 4-6 hours as needed for pain, please do not take more than 3000 mg in a 24-hour period.   Please rest your back over the next week and do not do any heavy lifting.  I have attached information about muscle strain and tips you can use to help with the back pain.      ED Prescriptions     Medication Sig Dispense Auth. Provider   methocarbamol (ROBAXIN) 500 MG tablet Take 1 tablet (500 mg total) by mouth at bedtime. 7 tablet Debby Freiberg, NP      PDMP not reviewed this encounter.   Debby Freiberg, NP 07/22/22 1028

## 2022-07-22 NOTE — Discharge Instructions (Addendum)
Robaxin was sent to the pharmacy, may take this medication at night to help relax the muscle.  This medication can make you sleepy, please do not operate a car or heavy machinery after taking this medication.   As discussed, nonsteroidal anti-inflammatory drugs like Aleve or naproxen can be used darting tomorrow for the next couple days, but per our conversation if you would not like to use these medications, you can use Tylenol over-the-counter for pain relief, you can take Tylenol every 4-6 hours as needed for pain, please do not take more than 3000 mg in a 24-hour period.   Please rest your back over the next week and do not do any heavy lifting.  I have attached information about muscle strain and tips you can use to help with the back pain.

## 2022-07-22 NOTE — ED Triage Notes (Signed)
Pt reports right lower back pain x 2 days. States he hurt his back when lifting his roommate from a wheelchair. Denies taking any OTC medication .

## 2022-12-03 ENCOUNTER — Emergency Department (HOSPITAL_COMMUNITY)
Admission: EM | Admit: 2022-12-03 | Discharge: 2022-12-03 | Disposition: A | Discharge status: Home / Self Care | Payer: Medicaid Other | Attending: Emergency Medicine | Admitting: Emergency Medicine

## 2022-12-03 ENCOUNTER — Emergency Department (HOSPITAL_COMMUNITY): Payer: Medicaid Other

## 2022-12-03 ENCOUNTER — Encounter (HOSPITAL_COMMUNITY): Payer: Self-pay

## 2022-12-03 ENCOUNTER — Other Ambulatory Visit: Payer: Self-pay

## 2022-12-03 DIAGNOSIS — N3001 Acute cystitis with hematuria: Secondary | ICD-10-CM | POA: Insufficient documentation

## 2022-12-03 DIAGNOSIS — N451 Epididymitis: Secondary | ICD-10-CM | POA: Insufficient documentation

## 2022-12-03 DIAGNOSIS — N50811 Right testicular pain: Secondary | ICD-10-CM | POA: Diagnosis present

## 2022-12-03 LAB — COMPREHENSIVE METABOLIC PANEL
ALT: 21 U/L (ref 0–44)
AST: 20 U/L (ref 15–41)
Albumin: 3.4 g/dL — ABNORMAL LOW (ref 3.5–5.0)
Alkaline Phosphatase: 81 U/L (ref 38–126)
Anion gap: 8 (ref 5–15)
BUN: 8 mg/dL (ref 6–20)
CO2: 25 mmol/L (ref 22–32)
Calcium: 8.8 mg/dL — ABNORMAL LOW (ref 8.9–10.3)
Chloride: 103 mmol/L (ref 98–111)
Creatinine, Ser: 0.96 mg/dL (ref 0.61–1.24)
GFR, Estimated: >60 mL/min (ref >60)
Glucose, Bld: 136 mg/dL — ABNORMAL HIGH (ref 70–99)
Potassium: 3.9 mmol/L (ref 3.5–5.1)
Sodium: 136 mmol/L (ref 135–145)
Total Bilirubin: 0.5 mg/dL (ref 0.3–1.2)
Total Protein: 6.7 g/dL (ref 6.5–8.1)

## 2022-12-03 LAB — URINALYSIS, ROUTINE W REFLEX MICROSCOPIC
Bilirubin Urine: NEGATIVE
Glucose, UA: NEGATIVE mg/dL
Ketones, ur: NEGATIVE mg/dL
Nitrite: NEGATIVE
Protein, ur: 30 mg/dL — AB
Specific Gravity, Urine: 1.018 (ref 1.005–1.030)
WBC, UA: >50 WBC/hpf (ref 0–5)
pH: 7 (ref 5.0–8.0)

## 2022-12-03 LAB — CBC WITH DIFFERENTIAL/PLATELET
Abs Immature Granulocytes: 0.05 10*3/uL (ref 0.00–0.07)
Basophils Absolute: 0.1 10*3/uL (ref 0.0–0.1)
Basophils Relative: 0 %
Eosinophils Absolute: 0.1 10*3/uL (ref 0.0–0.5)
Eosinophils Relative: 1 %
HCT: 41 % (ref 39.0–52.0)
Hemoglobin: 13.8 g/dL (ref 13.0–17.0)
Immature Granulocytes: 0 %
Lymphocytes Relative: 15 %
Lymphs Abs: 2.4 10*3/uL (ref 0.7–4.0)
MCH: 28.8 pg (ref 26.0–34.0)
MCHC: 33.7 g/dL (ref 30.0–36.0)
MCV: 85.4 fL (ref 80.0–100.0)
Monocytes Absolute: 1.4 10*3/uL — ABNORMAL HIGH (ref 0.1–1.0)
Monocytes Relative: 9 %
Neutro Abs: 11.8 10*3/uL — ABNORMAL HIGH (ref 1.7–7.7)
Neutrophils Relative %: 75 %
Platelets: 286 10*3/uL (ref 150–400)
RBC: 4.8 MIL/uL (ref 4.22–5.81)
RDW: 13.9 % (ref 11.5–15.5)
WBC: 15.9 10*3/uL — ABNORMAL HIGH (ref 4.0–10.5)
nRBC: 0 % (ref 0.0–0.2)

## 2022-12-03 LAB — LIPASE, BLOOD: Lipase: 26 U/L (ref 11–51)

## 2022-12-03 LAB — HIV ANTIBODY (ROUTINE TESTING W REFLEX): HIV Screen 4th Generation wRfx: NONREACTIVE

## 2022-12-03 MED ORDER — IBUPROFEN 800 MG PO TABS: 800.0000 mg | ORAL_TABLET | Freq: Three times a day (TID) | ORAL | 0 refills | Status: DC

## 2022-12-03 MED ORDER — ACETAMINOPHEN 500 MG PO TABS
1000.0000 mg | ORAL_TABLET | Freq: Once | ORAL | Status: AC
  Administered 2022-12-03: 1000 mg via ORAL
  Filled 2022-12-03: qty 2

## 2022-12-03 MED ORDER — LEVOFLOXACIN 500 MG PO TABS
500.0000 mg | ORAL_TABLET | Freq: Once | ORAL | Status: AC
  Administered 2022-12-03: 500 mg via ORAL
  Filled 2022-12-03: qty 1

## 2022-12-03 MED ORDER — IBUPROFEN 800 MG PO TABS
800.0000 mg | ORAL_TABLET | Freq: Once | ORAL | Status: AC
  Administered 2022-12-03: 800 mg via ORAL
  Filled 2022-12-03: qty 1

## 2022-12-03 MED ORDER — LEVOFLOXACIN 500 MG PO TABS: 500.0000 mg | ORAL_TABLET | Freq: Every day | ORAL | 0 refills | Status: DC

## 2022-12-03 NOTE — ED Provider Notes (Signed)
[facility] Provider Note   CSN: [id number] Arrival date & time: 12/03/22  1209     History  Chief Complaint  Patient presents with   Groin Swelling   Testicle Pain    Roger Oconnell is a 46 y.o. male.  HPI Patient presents had 4 days of swelling of the right testicle.  He reports its gotten very red and painful.  He has not had any trauma.  Patient reports that he is in a monogamous relationship with his wife.  They have been married 20 years and does not suspect STD.  He reports 1 time several years ago he was told that he had prostate enlargement and was given some antibiotics.  He has never had a recurrence of that problem.  He does not have any associated back pain.  He reports some discomfort in his lower abdomen.  No penile drainage or discharge.    Home Medications Prior to Admission medications   Medication Sig Start Date End Date Taking? Authorizing Provider  ibuprofen (ADVIL) 800 MG tablet Take 1 tablet (800 mg total) by mouth 3 (three) times daily. 12/03/22  Yes Charlesetta Shanks, MD  levofloxacin (LEVAQUIN) 500 MG tablet Take 1 tablet (500 mg total) by mouth daily. 12/03/22  Yes Evani Shrider, Jeannie Done, MD  ALPRAZolam Duanne Moron) 1 MG tablet Take 1 mg by mouth 3 (three) times daily as needed for anxiety. 12/10/17   [provider]  amoxicillin (AMOXIL) 500 MG capsule Take 1 capsule (500 mg total) by mouth 3 (three) times daily. 03/18/21   Arnaldo Natal, MD  amphetamine-dextroamphetamine (ADDERALL) 30 MG tablet Take 1 tablet by mouth 2 (two) times daily. 10/07/17   [provider]  FLUoxetine (PROZAC) 40 MG capsule Take 80 mg by mouth every morning. 03/06/21   [provider]  gabapentin (NEURONTIN) 600 MG tablet Take 600 mg by mouth 3 (three) times daily. 03/07/21   [provider]  ibuprofen (ADVIL) 600 MG tablet Take 1 tablet (600 mg total) by mouth every 6 (six) hours as needed. 01/09/21   Robinson, [place] N, PA-C   INVEGA SUSTENNA 234 MG/1.5ML SUSP injection Inject 234 mg into the muscle every 30 (thirty) days. 12/10/17   [provider]  lamoTRIgine (LAMICTAL) 150 MG tablet Take 300 mg by mouth daily. 03/06/21   [provider]  methocarbamol (ROBAXIN) 500 MG tablet Take 1 tablet (500 mg total) by mouth at bedtime. 07/22/22   Flossie Dibble, NP  omeprazole (PRILOSEC) 20 MG capsule Take 20 mg by mouth every morning. 03/15/21   [provider]  QUEtiapine (SEROQUEL) 200 MG tablet Take 200 mg by mouth daily. 03/06/21   [provider]  Vitamin D, Ergocalciferol, (DRISDOL) 1.25 MG (50000 UNIT) CAPS capsule Take 50,000 Units by mouth once a week. Sunday 03/06/21   [provider]      Allergies    Patient has no known allergies.    Review of Systems   Review of Systems  Physical Exam Updated Vital Signs BP 110/76 (BP Location: Right Arm)   Pulse 86   Temp 100.1 F (37.8 C) (Oral)   Resp 18   Ht '5\' 4"'$  (1.626 m)   Wt 63.5 kg   SpO2 99%   BMI 24.03 kg/m  Physical Exam Constitutional:      Comments: Alert nontoxic well in appearance.  Cardiovascular:     Rate and Rhythm: Normal rate and regular rhythm.  Pulmonary:  Effort: Pulmonary effort is normal.     Breath sounds: Normal breath sounds.  Abdominal:     General: There is no distension.     Palpations: Abdomen is soft.     Tenderness: There is no abdominal tenderness. There is no guarding.  Genitourinary:    Comments: Left testicle nontender and nonenlarged.  Right testicle very tender with swelling of the epididymis and erythema but no scrotal wall induration or mass.  No inguinal lymphadenopathy.  Penis normal without discharge or lesions. Musculoskeletal:        General: No swelling or tenderness.  Skin:    General: Skin is warm and dry.  Neurological:     General: No focal deficit present.     Mental Status: He is oriented to person, place, and time.     ED Results / Procedures /  Treatments   Labs (all labs ordered are listed, but only abnormal results are displayed) Labs Reviewed  CBC WITH DIFFERENTIAL/PLATELET - Abnormal; Notable for the following components:      Result Value   WBC 15.9 (*)    Neutro Abs 11.8 (*)    Monocytes Absolute 1.4 (*)    All other components within normal limits  URINALYSIS, ROUTINE W REFLEX MICROSCOPIC - Abnormal; Notable for the following components:   Color, Urine AMBER (*)    APPearance CLOUDY (*)    Hgb urine dipstick SMALL (*)    Protein, ur 30 (*)    Leukocytes,Ua MODERATE (*)    Bacteria, UA MANY (*)    All other components within normal limits  COMPREHENSIVE METABOLIC PANEL - Abnormal; Notable for the following components:   Glucose, Bld 136 (*)    Calcium 8.8 (*)    Albumin 3.4 (*)    All other components within normal limits  LIPASE, BLOOD  HIV ANTIBODY (ROUTINE TESTING W REFLEX)  GC/CHLAMYDIA PROBE AMP ([place]) NOT AT [facility]    EKG None  Radiology US SCROTUM W/DOPPLER  Result Date: 12/03/2022 CLINICAL DATA:  Right scrotal pain. EXAM: SCROTAL ULTRASOUND DOPPLER ULTRASOUND OF THE TESTICLES TECHNIQUE: Complete ultrasound examination of the testicles, epididymis, and other scrotal structures was performed. Color and spectral Doppler ultrasound were also utilized to evaluate blood flow to the testicles. COMPARISON:  None Available. FINDINGS: Right testicle Measurements: 3.2 x 3.1 x 2.6 cm. No mass or microlithiasis visualized. Left testicle Measurements: 3.9 x 2.7 x 2.6 cm. No mass or microlithiasis visualized. Right epididymis: Enlarged and hypervascular suggesting epididymitis. Left epididymis:  3 mm cyst. Hydrocele:  Mild right hydrocele is noted. Varicocele:  None visualized. Pulsed Doppler interrogation of both testes demonstrates normal low resistance arterial and venous waveforms bilaterally. IMPRESSION: No evidence of testicular mass or torsion. Enlarged and hypervascular right epididymis is noted suggesting  epididymitis. Mild right hydrocele is noted. Electronically Signed   By: Marijo Conception M.D.   On: 12/03/2022 13:24    Procedures Procedures    Medications Ordered in ED Medications  ibuprofen (ADVIL) tablet 800 mg (800 mg Oral Given 12/03/22 1732)  acetaminophen (TYLENOL) tablet 1,000 mg (1,000 mg Oral Given 12/03/22 1732)  levofloxacin (LEVAQUIN) tablet 500 mg (500 mg Oral Given 12/03/22 1732)    ED Course/ Medical Decision Making/ A&P                             Medical Decision Making Risk OTC drugs. Prescription drug management.   Patient denies any significant medical problems.  He has started getting right testicle swelling 4 days ago.  No injury.  Differential diagnosis includes testicular torsion\epididymitis\abscess/orchitis.  Ultrasound reviewed by radiology consistent with epididymitis.  Patient does have a 15,000 white count.  Urinalysis positive for gastritis and greater than 50 WBCs.  Patient is nontoxic without other risk factors.  At this time will initiate antibiotic therapy.  Patient denies risk for STD.  Does describe being diagnosed once before 2 years ago with possible prostatitis.  Will opt for Levaquin for epididymitis and coverage for prostatitis as well with positive urinalysis.  Have added GC chlamydia testing to urine specimen which can be treated if positive.  At this time I reviewed the plan with the patient to follow-up with his PCP, we also discussed follow-up with urology if worsening symptoms or not improving with treatment.  Patient voices understanding.        Final Clinical Impression(s) / ED Diagnoses Final diagnoses:  Epididymitis  Acute cystitis with hematuria    Rx / DC Orders ED Discharge Orders          Ordered    levofloxacin (LEVAQUIN) 500 MG tablet  Daily        12/03/22 1611    ibuprofen (ADVIL) 800 MG tablet  3 times daily        12/03/22 1611              Charlesetta Shanks, MD 12/03/22 1851

## 2022-12-03 NOTE — Discharge Instructions (Addendum)
1.  Follow-up with your doctor for recheck in 3 to 5 days. 2.  If your symptoms are not improving, you may need referral to a urologist. 3.  Antibiotics daily as prescribed. 4.  Take ibuprofen 800 mg every 8 hours with food for pain and swelling, you may also take extra strength Tylenol every 6 hours with the ibuprofen if you need additional pain control.  And use a towel roll to elevate your testicles. 5.  Return to the emergency department if you are getting worsening pain swelling or other additional symptoms.

## 2022-12-03 NOTE — ED Triage Notes (Signed)
Pt to ED c/o right testicle swelling and pain x 4 days,.

## 2022-12-03 NOTE — ED Provider Triage Note (Signed)
Emergency Medicine Provider Triage Evaluation Note  Roger Oconnell , a 47 y.o. male  was evaluated in triage.  Pt complains of right testicular swelling/pain x 4 days. Denies concerns for STI at this time.  Has associated right lower quadrant abdominal pain.  Denies nausea, vomiting, urinary symptoms.  Still has his appendix.   Review of Systems  Positive:  Negative:   Physical Exam  BP 98/82 (BP Location: Right Arm)   Pulse 85   Temp 98.9 F (37.2 C) (Oral)   Resp 18   Ht '5\' 4"'$  (1.626 m)   Wt 63.5 kg   SpO2 96%   BMI 24.03 kg/m  Gen:   Awake, no distress   Resp:  Normal effort  MSK:   Moves extremities without difficulty  Other:  Tenderness to palpation noted to right lower quadrant.  Scrotal exam deferred in triage.  Medical Decision Making  Medically screening exam initiated at 12:31 PM.  Appropriate orders placed.  Abisai Rigo was informed that the remainder of the evaluation will be completed by another provider, this initial triage assessment does not replace that evaluation, and the importance of remaining in the ED until their evaluation is complete.  Ultrasound ordered.   Giada Schoppe A, PA-C 12/03/22 1232

## 2022-12-06 ENCOUNTER — Encounter (HOSPITAL_COMMUNITY): Payer: Self-pay

## 2022-12-06 ENCOUNTER — Emergency Department (HOSPITAL_COMMUNITY)
Admission: EM | Admit: 2022-12-06 | Discharge: 2022-12-06 | Disposition: A | Discharge status: Home / Self Care | Payer: Medicaid Other | Attending: Emergency Medicine | Admitting: Emergency Medicine

## 2022-12-06 DIAGNOSIS — N50811 Right testicular pain: Secondary | ICD-10-CM | POA: Diagnosis present

## 2022-12-06 DIAGNOSIS — N451 Epididymitis: Secondary | ICD-10-CM | POA: Diagnosis not present

## 2022-12-06 LAB — BASIC METABOLIC PANEL
Anion gap: 10 (ref 5–15)
BUN: 12 mg/dL (ref 6–20)
CO2: 25 mmol/L (ref 22–32)
Calcium: 8.7 mg/dL — ABNORMAL LOW (ref 8.9–10.3)
Chloride: 104 mmol/L (ref 98–111)
Creatinine, Ser: 0.84 mg/dL (ref 0.61–1.24)
GFR, Estimated: >60 mL/min (ref >60)
Glucose, Bld: 111 mg/dL — ABNORMAL HIGH (ref 70–99)
Potassium: 3.8 mmol/L (ref 3.5–5.1)
Sodium: 139 mmol/L (ref 135–145)

## 2022-12-06 LAB — CBC
HCT: 39 % (ref 39.0–52.0)
Hemoglobin: 12.9 g/dL — ABNORMAL LOW (ref 13.0–17.0)
MCH: 28.2 pg (ref 26.0–34.0)
MCHC: 33.1 g/dL (ref 30.0–36.0)
MCV: 85.3 fL (ref 80.0–100.0)
Platelets: 334 10*3/uL (ref 150–400)
RBC: 4.57 MIL/uL (ref 4.22–5.81)
RDW: 14 % (ref 11.5–15.5)
WBC: 7.5 10*3/uL (ref 4.0–10.5)
nRBC: 0 % (ref 0.0–0.2)

## 2022-12-06 MED ORDER — ONDANSETRON HCL 4 MG/2ML IJ SOLN
4.0000 mg | Freq: Once | INTRAMUSCULAR | Status: AC
  Administered 2022-12-06: 4 mg via INTRAVENOUS
  Filled 2022-12-06: qty 2

## 2022-12-06 MED ORDER — ACETAMINOPHEN 500 MG PO TABS
1000.0000 mg | ORAL_TABLET | Freq: Once | ORAL | Status: AC
  Administered 2022-12-06: 1000 mg via ORAL
  Filled 2022-12-06: qty 2

## 2022-12-06 MED ORDER — DOXYCYCLINE HYCLATE 100 MG PO CAPS: 100.0000 mg | ORAL_CAPSULE | Freq: Two times a day (BID) | ORAL | 0 refills | Status: DC

## 2022-12-06 MED ORDER — SODIUM CHLORIDE 0.9 % IV BOLUS
1000.0000 mL | Freq: Once | INTRAVENOUS | Status: AC
  Administered 2022-12-06: 1000 mL via INTRAVENOUS

## 2022-12-06 MED ORDER — AZITHROMYCIN 1 G PO PACK
1.0000 g | PACK | Freq: Once | ORAL | Status: AC
  Administered 2022-12-06: 1 g via ORAL
  Filled 2022-12-06: qty 1

## 2022-12-06 MED ORDER — KETOROLAC TROMETHAMINE 15 MG/ML IJ SOLN
15.0000 mg | Freq: Once | INTRAMUSCULAR | Status: AC
  Administered 2022-12-06: 15 mg via INTRAVENOUS
  Filled 2022-12-06: qty 1

## 2022-12-06 MED ORDER — SODIUM CHLORIDE 0.9 % IV SOLN
2.0000 g | Freq: Once | INTRAVENOUS | Status: AC
  Administered 2022-12-06: 2 g via INTRAVENOUS
  Filled 2022-12-06: qty 20

## 2022-12-06 MED ORDER — HYDROMORPHONE HCL 1 MG/ML IJ SOLN
0.5000 mg | Freq: Once | INTRAMUSCULAR | Status: AC
  Administered 2022-12-06: 0.5 mg via INTRAVENOUS
  Filled 2022-12-06: qty 1

## 2022-12-06 MED ORDER — OXYCODONE-ACETAMINOPHEN 5-325 MG PO TABS: 1.0000 | ORAL_TABLET | Freq: Four times a day (QID) | ORAL | 0 refills | Status: DC | PRN

## 2022-12-06 NOTE — ED Triage Notes (Signed)
Pt reports right testicle swelling, pain and redness worsening x 4-5 days. Was seen here on 2/29 and started on Levofloxacin (still taking).

## 2022-12-06 NOTE — Discharge Instructions (Addendum)
It was our pleasure to provide your ER care today - we hope that you feel better.  Take antibiotic as prescribed (finish current, and also take doxycycline as prescribed).    Wear supportive underwear ('tighty-whities') for comfort and support.   Take motrin or aleve as need for pain. You may also take percocet as need for pain. No driving for the next 6 hours or when taking percocet. Also, do not take tylenol or acetaminophen containing medication when taking percocet.   Follow up closely with primary care doctor or urologist this coming week if symptoms fail to improve/resolve.  Return to ER right away if worse, new symptoms, high fevers, increased swelling/spreading redness, severe pain, weak/faint, or other concern.

## 2022-12-06 NOTE — ED Provider Notes (Signed)
[facility] Provider Note   CSN: [id number] Arrival date & time: 12/06/22  [id number]     History  Chief Complaint  Patient presents with   Testicle Pain    Roger Oconnell is a 47 y.o. male.  Pt c/o persistent pain and swelling to right testicle. States symptoms acute onset 3-4 days ago. Was seen in ED 2/28 and had u/s and dx with epididymitis. Has been taking levauin 500 mg qday since then but symptoms not yet improving. No abrupt worsening this AM.  Denies abdominal pain or distension. No vomiting. No dysuria or penile d/c. No fever, chills or sweats.   The history is provided by the patient, medical records and the spouse.  Testicle Pain Pertinent negatives include no chest pain, no abdominal pain and no shortness of breath.       Home Medications Prior to Admission medications   Medication Sig Start Date End Date Taking? Authorizing Provider  doxycycline (VIBRAMYCIN) 100 MG capsule Take 1 capsule (100 mg total) by mouth 2 (two) times daily. 12/06/22  Yes Lajean Saver, MD  oxyCODONE-acetaminophen (PERCOCET/ROXICET) 5-325 MG tablet Take 1-2 tablets by mouth every 6 (six) hours as needed for severe pain. 12/06/22  Yes Lajean Saver, MD  ALPRAZolam Duanne Moron) 1 MG tablet Take 1 mg by mouth 3 (three) times daily as needed for anxiety. 12/10/17   [provider]  amoxicillin (AMOXIL) 500 MG capsule Take 1 capsule (500 mg total) by mouth 3 (three) times daily. 03/18/21   Arnaldo Natal, MD  amphetamine-dextroamphetamine (ADDERALL) 30 MG tablet Take 1 tablet by mouth 2 (two) times daily. 10/07/17   [provider]  FLUoxetine (PROZAC) 40 MG capsule Take 80 mg by mouth every morning. 03/06/21   [provider]  gabapentin (NEURONTIN) 600 MG tablet Take 600 mg by mouth 3 (three) times daily. 03/07/21   [provider]  ibuprofen (ADVIL) 600 MG tablet Take 1 tablet (600 mg total) by mouth every 6 (six) hours as needed. 01/09/21    Robinson, [place] N, PA-C  ibuprofen (ADVIL) 800 MG tablet Take 1 tablet (800 mg total) by mouth 3 (three) times daily. 12/03/22   Charlesetta Shanks, MD  INVEGA SUSTENNA 234 MG/1.5ML SUSP injection Inject 234 mg into the muscle every 30 (thirty) days. 12/10/17   [provider]  lamoTRIgine (LAMICTAL) 150 MG tablet Take 300 mg by mouth daily. 03/06/21   [provider]  levofloxacin (LEVAQUIN) 500 MG tablet Take 1 tablet (500 mg total) by mouth daily. 12/03/22   Charlesetta Shanks, MD  methocarbamol (ROBAXIN) 500 MG tablet Take 1 tablet (500 mg total) by mouth at bedtime. 07/22/22   Flossie Dibble, NP  omeprazole (PRILOSEC) 20 MG capsule Take 20 mg by mouth every morning. 03/15/21   [provider]  QUEtiapine (SEROQUEL) 200 MG tablet Take 200 mg by mouth daily. 03/06/21   [provider]  Vitamin D, Ergocalciferol, (DRISDOL) 1.25 MG (50000 UNIT) CAPS capsule Take 50,000 Units by mouth once a week. Sunday 03/06/21   [provider]      Allergies    Patient has no known allergies.    Review of Systems   Review of Systems  Constitutional:  Negative for chills and fever.  Respiratory:  Negative for shortness of breath.   Cardiovascular:  Negative for chest pain.  Gastrointestinal:  Negative for abdominal pain and vomiting.  Genitourinary:  Positive for testicular pain. Negative for dysuria and penile discharge.  Skin:  Negative for rash.    Physical Exam Updated Vital Signs BP 120/88   Pulse 73   Temp 98.3 F (36.8 C) (Oral)   Resp 18   SpO2 100%  Physical Exam Vitals and nursing note reviewed.  Constitutional:      Appearance: Normal appearance. He is well-developed.  HENT:     Head: Atraumatic.     Nose: Nose normal.     Mouth/Throat:     Mouth: Mucous membranes are moist.  Eyes:     General: No scleral icterus.    Conjunctiva/sclera: Conjunctivae normal.  Neck:     Trachea: No tracheal deviation.  Cardiovascular:     Rate and Rhythm:  Normal rate and regular rhythm.     Pulses: Normal pulses.     Heart sounds: Normal heart sounds.  Pulmonary:     Effort: Pulmonary effort is normal. No accessory muscle usage or respiratory distress.     Breath sounds: Normal breath sounds.  Abdominal:     General: There is no distension.     Palpations: Abdomen is soft.     Tenderness: There is no abdominal tenderness.  Genitourinary:    Comments: No cva tenderness. Right scrotal/testicular swelling and tenderness. No mass felt. +cremasteric reflex intact. No abscess noted. No crepitus noted. Skin of right scrotum mildly erythematous but otherwise normal/healthy appearing - no findings c/w aggressive soft tissue infection noted.  Musculoskeletal:        General: No swelling.     Cervical back: Neck supple.  Skin:    General: Skin is warm and dry.     Findings: No rash.  Neurological:     Mental Status: He is alert.     Comments: Alert, speech clear.   Psychiatric:        Mood and Affect: Mood normal.     ED Results / Procedures / Treatments   Labs (all labs ordered are listed, but only abnormal results are displayed) Results for orders placed or performed during the hospital encounter of [id number]  Basic metabolic panel  Result Value Ref Range   Sodium 139 135 - 145 mmol/L   Potassium 3.8 3.5 - 5.1 mmol/L   Chloride 104 98 - 111 mmol/L   CO2 25 22 - 32 mmol/L   Glucose, Bld 111 (H) 70 - 99 mg/dL   BUN 12 6 - 20 mg/dL   Creatinine, Ser 0.84 0.61 - 1.24 mg/dL   Calcium 8.7 (L) 8.9 - 10.3 mg/dL   GFR, Estimated >60 >60 mL/min   Anion gap 10 5 - 15  CBC  Result Value Ref Range   WBC 7.5 4.0 - 10.5 K/uL   RBC 4.57 4.22 - 5.81 MIL/uL   Hemoglobin 12.9 (L) 13.0 - 17.0 g/dL   HCT 39.0 39.0 - 52.0 %   MCV 85.3 80.0 - 100.0 fL   MCH 28.2 26.0 - 34.0 pg   MCHC 33.1 30.0 - 36.0 g/dL   RDW 14.0 11.5 - 15.5 %   Platelets 334 150 - 400 K/uL   nRBC 0.0 0.0 - 0.2 %   US SCROTUM W/DOPPLER  Result Date: 12/03/2022 CLINICAL  DATA:  Right scrotal pain. EXAM: SCROTAL ULTRASOUND DOPPLER ULTRASOUND OF THE TESTICLES TECHNIQUE: Complete ultrasound examination of the testicles, epididymis, and other scrotal structures was performed. Color and spectral Doppler ultrasound were also utilized to evaluate blood flow to the testicles. COMPARISON:  None Available. FINDINGS: Right testicle Measurements: 3.2 x 3.1 x 2.6 cm. No mass  or microlithiasis visualized. Left testicle Measurements: 3.9 x 2.7 x 2.6 cm. No mass or microlithiasis visualized. Right epididymis: Enlarged and hypervascular suggesting epididymitis. Left epididymis:  3 mm cyst. Hydrocele:  Mild right hydrocele is noted. Varicocele:  None visualized. Pulsed Doppler interrogation of both testes demonstrates normal low resistance arterial and venous waveforms bilaterally. IMPRESSION: No evidence of testicular mass or torsion. Enlarged and hypervascular right epididymis is noted suggesting epididymitis. Mild right hydrocele is noted. Electronically Signed   By: Marijo Conception M.D.   On: 12/03/2022 13:24     EKG None  Radiology No results found.  Procedures Procedures    Medications Ordered in ED Medications  cefTRIAXone (ROCEPHIN) 2 g in sodium chloride 0.9 % 100 mL IVPB (0 g Intravenous Stopped 12/06/22 0918)  azithromycin (ZITHROMAX) powder 1 g (1 g Oral Given 12/06/22 0829)  HYDROmorphone (DILAUDID) injection 0.5 mg (0.5 mg Intravenous Given 12/06/22 0817)  ondansetron (ZOFRAN) injection 4 mg (4 mg Intravenous Given 12/06/22 0817)  sodium chloride 0.9 % bolus 1,000 mL (0 mLs Intravenous Stopped 12/06/22 0918)  ketorolac (TORADOL) 15 MG/ML injection 15 mg (15 mg Intravenous Given 12/06/22 0922)  acetaminophen (TYLENOL) tablet 1,000 mg (1,000 mg Oral Given 12/06/22 [id number])    ED Course/ Medical Decision Making/ A&P                             Medical Decision Making Problems Addressed: Epididymitis: acute illness or injury with systemic symptoms that poses a threat to life or  bodily functions  Amount and/or Complexity of Data Reviewed Independent Historian: spouse    Details: hx External Data Reviewed: labs, radiology and notes. Labs: ordered. Decision-making details documented in ED Course.  Risk OTC drugs. Prescription drug management. Parenteral controlled substances. Decision regarding hospitalization.   Iv ns. Continuous pulse ox and cardiac monitoring. Labs ordered/sent.   Differential diagnosis includes epididymitis, orchitis, uti, etc . Dispo decision including potential need for admission considered - will get labs and reassess.   Reviewed nursing notes and prior charts for additional history. External reports reviewed. Recent u/s neg for torsion - no acute/abrupt change in pain since that study.  No prior u cx done. Gc/chlam not resulted. Additional history from: spouse.   Cardiac monitor: sinus rhythm, rate 80.  Labs reviewed/interpreted by me -   Recent u/s reviewed/interpreted by me - no torsion.  Rocephin iv. Zithromax po. Dilaudid iv, ns bolus, zofran iv.   Recheck, pt reports feeling much improved. No nv. No fever/chills. Pt requests rx percocet - small quantity rx provided. Will add doxy.   Pt currently appears stable for d/c.  Rec close pcp/urology f/u.  Return precautions provided.            Final Clinical Impression(s) / ED Diagnoses Final diagnoses:  Epididymitis    Rx / DC Orders ED Discharge Orders          Ordered    doxycycline (VIBRAMYCIN) 100 MG capsule  2 times daily        12/06/22 1015    oxyCODONE-acetaminophen (PERCOCET/ROXICET) 5-325 MG tablet  Every 6 hours PRN        12/06/22 1015              Lajean Saver, MD 12/06/22 1017

## 2024-02-01 ENCOUNTER — Encounter (HOSPITAL_COMMUNITY): Payer: Self-pay

## 2024-02-01 ENCOUNTER — Ambulatory Visit (HOSPITAL_COMMUNITY)
Admission: RE | Admit: 2024-02-01 | Discharge: 2024-02-01 | Disposition: A | Discharge status: Home / Self Care | Payer: MEDICAID | Source: Ambulatory Visit | Attending: Family Medicine | Admitting: Family Medicine

## 2024-02-01 VITALS — BP 116/82 | HR 83 | Temp 98.3°F | Resp 14

## 2024-02-01 DIAGNOSIS — K0889 Other specified disorders of teeth and supporting structures: Secondary | ICD-10-CM

## 2024-02-01 DIAGNOSIS — K047 Periapical abscess without sinus: Secondary | ICD-10-CM

## 2024-02-01 HISTORY — DX: Other chronic pain: G89.29

## 2024-02-01 MED ORDER — AMOXICILLIN-POT CLAVULANATE 875-125 MG PO TABS: 1.0000 | ORAL_TABLET | Freq: Two times a day (BID) | ORAL | 0 refills | Status: AC

## 2024-02-01 NOTE — Discharge Instructions (Addendum)
 You were seen today for a dental abscess/infection.  I have sent out an oral antibiotic to take for the twice/day x 10 days.  You may continue your current home medications for pain.  Please follow up with a dentist.  See list below:       [facility] Adult Primary Care, Pharmacy [address] [place] [address] Visit Website [phone]  [facility] Adult Primary Care, Mental Health [address] [place], [place] [address] Visit Website [facility] [phone]  [facility] [address], [place] [address] Visit Website Buckley Card [phone]  [facility] Adult Primary Care, Dental, Pharmacy, Mental Health [address] [place], [place] [address] Visit Website Patrica Bookman [phone]  [facility] Adult Primary Care, Mental Health [address] [place], [place] [address] Visit Website Deann Exon, [place], [place], [place] [phone]  [facility] Adult Primary Care, Pharmacy, Mental Health [address] [place], [place] [address] Visit Website Ellsworth Haas, [place] [phone]  [facility] Adult Primary Care, Dental, Pharmacy [address], [place], [place], [place]  Visit Website Irvine Mantis [place], [place] [phone]  [facility] Adult Primary Care, Dental, Pharmacy [address], [place] [address] Visit Website Conception Decree [phone]  [facility] [employer] [address] [place], [place] [address] Visit Website [place] [phone]  [facility] Adult Primary Care [address] [place], [place] [address] Visit Website [place], [place], [place] [phone]  [facility] Adult Primary Care, Dental, Pharmacy, Mental Health [address] [place], [place] [address] Visit  Website Arlina Benjamin, [place] [phone]  [facility] [place] [facility] Adult Primary Care, Mental Health [address] [place], [place] [address] Visit Website [place], [place], Laurina Popper [phone]  [facility] Adult Primary Care, Dental, Pharmacy, Mental Health [address] Adrianne [place], [place] [address] Visit Website [place], Barnabas Lia, [place]  [phone]  [facility] Pharmacy [address] [place], [place] [address] Visit Website Artice Last, Arlice Bene [phone]  [facility] Adult Primary Care, Pharmacy, Mental Health [address] [place], [place] [address] Visit Website Elba Greathouse [phone]  [facility] Adult Primary Care, Dental, Pharmacy [address] [place], [place] [address] Visit Website Clair Crews [phone]  [facility] Adult Primary Care, Pharmacy, Mental Health [address] [place], [place] [address] Visit Website Gay Katayama [phone]  [facility] Adult Primary Care, Pharmacy [address] [place], [place] [address] Visit Website [place] [phone]  [facility] Adult Primary Care, Pharmacy [address] [place], [place] [address] Visit Website Stevens [place]land [phone]  [facility] ([facility] [place] [facility]) Adult Primary Care, Dental [phone] [place], [place] [address] Visit Website Starr Eddy [phone]  [facility] Pharm[facility] [address] [place], [place] [address] Visit Website Dozier Genre [phone]  [facility] Adult Primary Care [address] [place], [place] [address] Visit Website [place], [place], [place], [place], [place], [place] [phone]  [facility] [address] [facility] Adult Primary Care,  Mental Health [address] [place], [place] [address] Visit Website Michaeleen Adler [phone]  [facility] Adult Primary Care, Pharmacy, Mental Health [address] [place] [address] Visit Website Neligh [phone]  [facility] Adult Primary Care, Pharmacy, Mental Health [address] [place] [address] [place], [place] [address] Visit Website Judie Noun, [place] [phone]  [facility] Adult Primary Care, Pharmacy, Mental Health [address] [place], [place] [address] Visit Website Idolina Maker, [address] [place], Fabio Holts [phone]  [facility] [employer] Adult Primary Care, Dental, Pharmacy, Mental Health [address] [place], [place] [address] Visit Website Franklin Ito, [place] [phone]  [facility] Adult Primary Care [address], [place] [address] Visit Website Kristin Peyer [phone]  [facility] Adult Primary Care, Pharmacy, Mental Health [address] [place], [place] [address] Visit Website Jann Melody [phone]  Healing with Jerone Moorman. Dental, Mental Health  [address] [place], [place] [address] Visit Website [place] [phone]  [facility] [address] ([facility] Adan Adas. [facility]) Adult Primary Care, Pharmacy [address], [place] [address] Visit Website [place], [place], [place], [place], Marissa Sida [phone]  [facility] Pharmacy [address] [place], [place] [address] Visit Website Mardene Shake [phone]  [facility] Adult Primary Care, Pharmacy, Mental Health [address] [place], [place] [address] Visit Website Michaeleen Adler [phone]  [facility] Adult Primary Care, Pharmacy [address] [address] Visit Website [place], [place] [phone]  [facility] Adult Primary Care, Pharmacy, Mental Health [address] [place], [place] [address] Visit Website Doree Games [phone]  [facility] Adult Primary Care, Pharmacy [address] [place], [place] [address] Visit Website Raejean Bullock [phone]  [facility] Adult Primary Care, Pharmacy, Mental Health [address] [place], [place] [address] Visit Website Awilda Bogus, [place] [phone]  [facility] Pharmacy [address] [place], [place] [address] Visit Website Fronie Jewett [phone]  [facility]al M[facility] Adult Primary Care, Pharmacy [address] [place], [place] [address] Visit Website [place] [phone]  [facility] Adult Primary Care, Mental Health [address] Amy K[address] [place] [address] Visit Website Jone Neither [phone]  [facility] Adult Primary Care, Dental, Pharmacy, Mental Health [address] [place] [address] Visit Website [place], [place] [phone]  [facility] Pharmacy [address] [place], [place] [address] Visit Website Rosaria Common, Carolynn Citrin, [place] [phone]  [facility] Adult Primary Care, Dental, Pharmacy [address] Sharron [place], [place] [address] Visit Website Chase Copping [phone]  [facility] Adult Primary Care, Pharmacy, Mental Health [address] [place], [place] [address] Visit Website Alverda Joe [phone]  [facility]aron [facility] Adult Primary Care, Mental Health [address] [place], [place] [address] Visit Website [place] [phone]  [facility] [address] [place], [place] [address] Visit Website [place], [place] 100 counties [phone]  [facility] Adult Primary Care, Mental Health [address]  [place], [place] [address] Visit Website [place] [phone]  [facility] [address] [place], [place] [address] Visit Website Katharina Palin, Kristeen Peto [phone]  [facility] Adult Primary Care, Pharmacy [address] [place], [place] [address] Visit Website [place], [place], [place], [place], [place] [phone]  [facility] Pharmacy [phone]  [address] [place], [place] [address] Visit Website Henderson Lock, Jaxn [place], [place], [place] [phone]  Sen[facility] Pharmassist Pharmacy [address] [place], [place] [address] Visit Website [place] [phone]  [facility] Adult Primary Care, Pharmacy [address] [place], [place] [address] Visit Website Janise Melia, [address] [place], [place], [place], [place] Iba [phone]  [facility] Adult Primary Care, Pharmacy [address], [place] [address] Visit Website [place] [phone]  [facility] Code Primary Care Adult Primary Care, Pharmacy [address] [place] [address] [place], [place] [address] Visit Website Reymundo Caulk [phone]  [facility] Adult Primary Care, Pharmacy, Mental Health [address] [place], [place] [address] Visit Website Nicki Barnacle [phone]  [facility] Adult Primary Care, Pharmacy, Mental Health [address] [place], [place], [place] [address] Visit Website [facility] [phone]  Adult Primary Care, Pharmacy, Mental Health [address] [place], [place] [address] Visit Website [place], [place], [place], [place], [place], [place], [place], [place] [phone]  [facility] Adult Primary Care 1 Medical [address] [place], [place], [place] [address] Visit Website Ruben Corolla, Dallie Duel, [address] [place], [place], [place], [place], [place] Sicilian [phone]  [facility] Dental [address] [place], [place] [address] Visit  Website [facility] [phone]  [facility] Adult Primary Care, Pharmacy [address] [place], [place] [address] Visit Website [place] [phone]

## 2024-02-01 NOTE — ED Triage Notes (Signed)
 Patient reports that he has had right lower dental pain x 3 days and facial swelling since yesterday.  Patient states he has been taking Oycodone 5mg /325mg  which he receives from pain management.

## 2024-02-01 NOTE — ED Provider Notes (Signed)
 [facility]    CSN: [id number] Arrival date & time: 02/01/24  6962      History   Chief Complaint Chief Complaint  Patient presents with   Dental Pain    HPI Roger Oconnell is a 48 y.o. male.    Dental Pain  Patient is here for dental pain/abscess.  Noted pain 3 days ago to the right lowe jaw.  He then noted swelling.  No fevers/chills.  No n/v.  He is taking otc and home medications for pain.  He does not have a dentist.       Past Medical History:  Diagnosis Date   Chronic back pain    Chronic shoulder pain    Depression     Patient Active Problem List   Diagnosis Date Noted   HEADACHE, TENSION 03/16/2008   TINEA VERSICOLOR 10/08/2007   TOBACCO ABUSE 10/08/2007   Dental caries 10/08/2007   NECK PAIN 10/08/2007   LOW BACK PAIN SYNDROME 06/30/2007   FX CLOSED PHALANX, HAND NOS 01/05/2007    Past Surgical History:  Procedure Laterality Date   DENTAL RESTORATION/EXTRACTION WITH X-RAY         Home Medications    Prior to Admission medications   Medication Sig Start Date End Date Taking? Authorizing Provider  ALPRAZolam (XANAX) 1 MG tablet Take 1 mg by mouth 3 (three) times daily as needed for anxiety. 12/10/17   [provider]  amphetamine-dextroamphetamine (ADDERALL) 30 MG tablet Take 1 tablet by mouth 2 (two) times daily. 10/07/17   [provider]  ibuprofen  (ADVIL ) 800 MG tablet Take 1 tablet (800 mg total) by mouth 3 (three) times daily. 12/03/22   Wynetta Heckle, MD  omeprazole (PRILOSEC) 20 MG capsule Take 20 mg by mouth every morning. 03/15/21   [provider]  oxyCODONE -acetaminophen  (PERCOCET/ROXICET) 5-325 MG tablet Take 1-2 tablets by mouth every 6 (six) hours as needed for severe pain. 12/06/22   Steinl, Kevin, MD  Vitamin D, Ergocalciferol, (DRISDOL) 1.25 MG (50000 UNIT) CAPS capsule Take 50,000 Units by mouth once a week. Sunday 03/06/21   [provider]    Family History Family History   Problem Relation Age of Onset   Coronary aneurysm Mother    Liver disease Father     Social History Social History   Tobacco Use   Smoking status: Every Day    Current packs/day: 0.50    Types: Cigarettes   Smokeless tobacco: Never  Vaping Use   Vaping status: Never Used  Substance Use Topics   Alcohol use: No   Drug use: No     Allergies   Patient has no known allergies.   Review of Systems Review of Systems  Constitutional: Negative.   HENT:  Positive for dental problem.   Cardiovascular: Negative.   Gastrointestinal: Negative.   Musculoskeletal: Negative.   Psychiatric/Behavioral: Negative.       Physical Exam Triage Vital Signs ED Triage Vitals [02/01/24 0840]  Encounter Vitals Group     BP 116/82     Systolic BP Percentile      Diastolic BP Percentile      Pulse Rate 83     Resp 14     Temp 98.3 F (36.8 C)     Temp Source Oral     SpO2 93 %     Weight      Height      Head Circumference      Peak Flow      Pain  Score 10     Pain Loc      Pain Education      Exclude from Growth Chart    No data found.  Updated Vital Signs BP 116/82 (BP Location: Left Arm)   Pulse 83   Temp 98.3 F (36.8 C) (Oral)   Resp 14   SpO2 93%   Visual Acuity Right Eye Distance:   Left Eye Distance:   Bilateral Distance:    Right Eye Near:   Left Eye Near:    Bilateral Near:     Physical Exam Constitutional:      Appearance: Normal appearance. He is normal weight.  HENT:     Head:     Comments: He has swelling to the right lower jaw;  tenderness; here;  no LAD noted;  He has poor dentition with missing teeth;  he has swelling to the right lower gums;  very tender;  Cardiovascular:     Rate and Rhythm: Normal rate and regular rhythm.  Pulmonary:     Effort: Pulmonary effort is normal.     Breath sounds: Normal breath sounds.  Neurological:     General: No focal deficit present.     Mental Status: He is alert.  Psychiatric:        Mood and  Affect: Mood normal.      UC Treatments / Results  Labs (all labs ordered are listed, but only abnormal results are displayed) Labs Reviewed - No data to display  EKG   Radiology No results found.  Procedures Procedures (including critical care time)  Medications Ordered in UC Medications - No data to display  Initial Impression / Assessment and Plan / UC Course  I have reviewed the triage vital signs and the nursing notes.  Pertinent labs & imaging results that were available during my care of the patient were reviewed by me and considered in my medical decision making (see chart for details).   Final Clinical Impressions(s) / UC Diagnoses   Final diagnoses:  Pain, dental  Dental abscess     Discharge Instructions      You were seen today for a dental abscess/infection.  I have sent out an oral antibiotic to take for the twice/day x 10 days.  You may continue your current home medications for pain.  Please follow up with a dentist.  See list below:       [facility] Adult [facility] Care, Pharmacy [address], [place] [address] Visit Website [phone]  [facility] Adult Primary Care, Mental Health [address] [place], [place] [address] Visit Website [facility] [phone]  [facility] [address], [place] [address] Visit Website Buckley Card [phone]  [facility] Adult Primary Care, Dental, Pharmacy, Mental Health [address] [place], [place] [address] Visit Website Patrica Bookman [phone]  [facility] Adult Primary Care, Mental Health [address] [place], [place] [address] Visit Website Deann Exon, Lahoma Pigg, [place], [place] [phone]  [facility] Adult Primary Care, Pharmacy, Mental Health [address] [place], [place] [address] Visit Website Ellsworth Haas, [place] [phone]  [facility] Adult Primary Care, Dental, Pharmacy [address] [place], [place], [place]  Visit Website Irvine Mantis [place], [place] [phone]  [facility] Adult Primary Care, Dental, Pharmacy [address] [place], [place] [address] Visit Website Conception Decree [phone]  [facility]  [employer] [address] [place], [place] [address] Visit Website [place] [phone]  [facility] Adult Primary Care [address] [place], [place] [address] Visit Website [place], [place], [place] [phone]  [facility] Adult Primary Care, Dental, Pharmacy, Mental Health [address] [place], [place] [address] Visit Website Arlina Benjamin, [place] [phone]  [facility] Adult Primary Care, Mental Health [address] [place], [place] [address] Visit Website [place], [place], Laurina Popper [phone]  [facility] Adult Primary Care, Dental, Pharmacy, Mental Health [address] Adrianne [place], [place] [address] Visit Website [place], Barnabas Lia, [place]  [phone]  [facility], Pharmacy [address] [place], [place] [address] Visit Website Artice Last, Arlice Bene [phone]  [facility] [place] Adult Primary Care, Pharmacy, Mental Health [address] [place], [place] [address] Visit Website Elba Greathouse [phone]  [facility] Adult Primary Care, Dental, Pharmacy [address] [place], [place] [address] Visit Website Clair Crews [phone]  [facility] Adult Primary Care, Pharmacy, Mental Health [address] [place], [place] [address] Visit Website Gay Katayama [phone]  [facility] Adult Primary Care, Pharmacy [address] [place], [place] [address] Visit Website [place] [phone]  [facility] Adult Primary Care, Pharmacy [address] [place], [place] [address] Visit Website Stevens [place]land [phone]  [facility]) Adult Primary Care, Dental [id number] [place], [place] [address] Visit Website Starr Eddy [phone]  [facility] Pharmacy [address] [place], [place] [address] Visit Website Dozier Genre [phone]  [facility] Adult Primary Care [address] [place], [place] [address] Visit Website [place], [place], [place], [place], [place], [place] [phone]  [facility] [address] [facility] Adult Primary Care, Mental Health [address] [place], [place] [address] Visit Website Michaeleen Adler [phone]  [facility] Adult Primary Care, Pharmacy, Mental Health [address] [place] [address] Visit Website [place] [phone]  [facility] Adult Primary Care, Pharmacy, Mental Health [address] [place], [place] [address] Visit Website Judie Noun, [place] [phone]  [facility] Adult Primary Care, Pharmacy, Mental Health [address] [place], [place] [address] Visit Website Idolina Maker, [address], [place], Fabio Holts [phone]  [facility] [employer] Adult Primary Care, Dental, Pharmacy, Mental Health [address] [place], [place] [address] Visit Website Franklin Ito, [place] [phone]  [facility] Adult Primary Care [address], [place] [address] Visit Website Kristin Peyer [phone]  [facility] Adult Primary Care, Pharmacy, Mental Health [address] [place], [place] [address] Visit  Website Jann Melody [phone]  Healing with Jerone Moorman. Dental, Mental Health  [address] [place], [place] [address] Visit Website [place] [phone]  [facility] [address] ([facility] Adan Adas. [facility]) Adult Primary Care, Pharmacy [address], [place] [address] Visit Website [place], [place], [place], [place], Marissa Sida [phone]  [facility] [address] [place] [address], [place] [address] Visit Website Mardene Shake [phone]  [facility] Adult Primary Care, Pharmacy, Mental Health [address] [place], [place] [address] Visit Website Michaeleen Adler [phone]  [facility] Adult Primary Care, Pharmacy  [address] [address] Visit Website [place], [place] [phone]  [facility] Adult Primary Care, Pharmacy, Mental Health [address] [place], [place] [address] Visit Website Doree Games [phone]  [facility] Adult Primary Care, Pharmacy [address] [place], [place] [address] Visit Website Raejean Bullock [phone]  [facility] Adult Primary Care, Pharmacy, Mental Health [address] [place], [place] [address] Visit Website Awilda Bogus, [place] [phone]  [facility] Pharmacy [address] [place], [place] [address] Visit Website Fronie Jewett [phone]  [facility]al [facility] Adult Primary Care, Pharmacy [address] [place], [place] [address] Visit Website [place] [phone]  [facility] Adult Primary Care, Mental Health [address] Amy Kansky [address] [place] [address] Visit Website Jone Neither [phone]  [facility] Adult Primary Care, Dental, Pharmacy, Mental Health [address] [place] [address] Visit Website [place], [place] [phone]  [facility] Pharmacy [address]  [place], [place] [address] Visit Website Rosaria Common, Carolynn Citrin, [place] [phone]  [facility] Adult Primary Care, Dental, Pharmacy [address] [place]ron [place], [place] [address] Visit Website Chase Copping [phone]  [facility] Adult Primary Care, Pharmacy, Mental Health [address] [place], [place] [address] Visit Website Alverda Joe [phone]  [facility] Adult Primary Care, Mental Health [address] [place], [place] [address] Visit Website [place] [phone]  [facility] [address] [place], [place] [address] Visit Website [place], [place] [address] counties [phone]  [facility] Adult Primary Care, Mental Health [address] [place], [place] [address] Visit Website [place] [phone]  [facility] [address] [place], [place] [address] Visit Website Katharina Palin, Kristeen Peto [phone]  [facility] Adult Primary Care, Pharmacy [address] [place], [place] [address] Visit Website [place], [place], [place], [place], [place] [phone]  [facility] [address] [place], [place] [address] Visit Website Henderson Lock, Lavella Poser, [place] [phone]  [facility] [address] [place], [place] [address] Visit Website [place] [phone]  [facility] Adult Primary Care, Pharmacy [address] [place], [place] [address] Visit Website Abelina Hoes, [place], [place], Alexander Iba [phone]  [facility] Adult Primary Care, Pharmacy [address], [place] [address] Visit Website Nathalie [phone]  [facility] Adult Primary Care, Pharmacy [address] [place] [place], [place] [address] Visit Website Reymundo Caulk [phone]  [facility] Adult Primary Care, Pharmacy, Mental Health [address] [place], [place] [address] Visit Website Nicki Barnacle [phone]  [employer] [facility] Adult Primary Care, Pharmacy, Mental Health [address] [place], [place], [place] [address] Visit Website [facility] [phone]  Adult Primary Care, Pharmacy, Mental Health [address] [place], [place] [address] Visit Website [place], [place], [place], [place], [place], [place], [place], [place] [phone]  [facility] Adult Primary Care 1 Medical [address] [place], [place], [place] [address] Visit Website Ruben Corolla, Dallie Duel, [address] [place], [place], [place], [place], [place] Sic[place] [phone]  [facility] Dental [address] [place], [place] [address] Visit Website [facility] [phone]  [facility] Adult Primary Care, Pharmacy [address] [place], [place] [address] Visit Website Ravenel [phone]  ED Prescriptions     Medication Sig Dispense Auth. Provider   amoxicillin -clavulanate (AUGMENTIN) 875-125 MG tablet Take 1 tablet by mouth every 12 (twelve) hours for 10 days. 20 tablet Lesle Ras, MD      PDMP not reviewed this encounter.   Lesle Ras, MD 02/01/24 [phone]

## 2024-05-02 ENCOUNTER — Encounter (HOSPITAL_COMMUNITY): Payer: Self-pay | Admitting: Emergency Medicine

## 2024-05-02 ENCOUNTER — Ambulatory Visit (HOSPITAL_COMMUNITY)
Admission: EM | Admit: 2024-05-02 | Discharge: 2024-05-02 | Disposition: A | Discharge status: Home / Self Care | Payer: MEDICAID

## 2024-05-02 DIAGNOSIS — K047 Periapical abscess without sinus: Secondary | ICD-10-CM

## 2024-05-02 MED ORDER — DICLOFENAC SODIUM 50 MG PO TBEC: 50.0000 mg | DELAYED_RELEASE_TABLET | Freq: Two times a day (BID) | ORAL | 1 refills | Status: AC

## 2024-05-02 MED ORDER — AMOXICILLIN-POT CLAVULANATE 875-125 MG PO TABS: 1.0000 | ORAL_TABLET | Freq: Two times a day (BID) | ORAL | 1 refills | Status: AC

## 2024-05-02 NOTE — Discharge Instructions (Addendum)
  1. Dental infection (Primary) - diclofenac  (VOLTAREN ) 50 MG EC tablet; Take 1 tablet (50 mg total) by mouth 2 (two) times daily.  Dispense: 30 tablet; Refill: 1 - amoxicillin -clavulanate (AUGMENTIN ) 875-125 MG tablet; Take 1 tablet by mouth every 12 (twelve) hours for 10 days.  Dispense: 20 tablet; Refill: 1 - Advised washing mouth out 2-3 times a day with Listerine or other mouthwash to prevent further bacterial infection. - Follow-up with dentist soon as possible for further evaluation and management of ongoing dental infections. - List of local dentists and other dental resources provided during visit.  Please follow-up with 1 of these dental providers for ongoing dental needs.

## 2024-05-02 NOTE — ED Triage Notes (Addendum)
 Pt reports lower dental pain x 2 days. States the pain is throbbing and located in the bottom 6 teeth in the front. Tried taking Tylenol  with no relief.

## 2024-05-02 NOTE — ED Provider Notes (Signed)
 [facility]  Note:  This document was prepared using Dragon voice recognition software and may include unintentional dictation errors.  MRN: [id number] DOB: 1975/12/02  Subjective:   Roger Oconnell is a 48 y.o. male presenting for lower dental pain and inflammation x 2 to 3 days.  Patient reports that he has been taking Tylenol  with minimal improvement.  Patient has past history of dental infections most recently about 2 to 3 months ago.  Patient reports that he does not have a dentist for follow-up evaluation.  Patient denies any fever or other secondary symptoms at this time.  No current facility-administered medications for this encounter.  Current Outpatient Medications:    amoxicillin -clavulanate (AUGMENTIN ) 875-125 MG tablet, Take 1 tablet by mouth every 12 (twelve) hours for 10 days., Disp: 20 tablet, Rfl: 1   diclofenac  (VOLTAREN ) 50 MG EC tablet, Take 1 tablet (50 mg total) by mouth 2 (two) times daily., Disp: 30 tablet, Rfl: 1   ALPRAZolam (XANAX) 1 MG tablet, Take 1 mg by mouth 3 (three) times daily as needed for anxiety., Disp: , Rfl: 5   amphetamine-dextroamphetamine (ADDERALL) 30 MG tablet, Take 1 tablet by mouth 2 (two) times daily., Disp: , Rfl: 0   omeprazole (PRILOSEC) 20 MG capsule, Take 20 mg by mouth every morning., Disp: , Rfl:    Vitamin D, Ergocalciferol, (DRISDOL) 1.25 MG (50000 UNIT) CAPS capsule, Take 50,000 Units by mouth once a week. Sunday, Disp: , Rfl:    No Known Allergies  Past Medical History:  Diagnosis Date   Chronic back pain    Chronic shoulder pain    Depression      Past Surgical History:  Procedure Laterality Date   DENTAL RESTORATION/EXTRACTION WITH X-RAY      Family History  Problem Relation Age of Onset   Coronary aneurysm Mother    Liver disease Father     Social History   Tobacco Use   Smoking status: Every Day    Current packs/day: 0.50    Types: Cigarettes   Smokeless tobacco: Never  Vaping Use   Vaping  status: Never Used  Substance Use Topics   Alcohol use: No   Drug use: No    ROS Refer to HPI for ROS details.  Objective:   Vitals: BP 121/74 (BP Location: Left Arm)   Pulse 100   Temp 97.6 F (36.4 C) (Oral)   Resp 18   SpO2 95%   Physical Exam Vitals and nursing note reviewed.  Constitutional:      General: He is not in acute distress.    Appearance: Normal appearance. He is well-developed. He is not ill-appearing or toxic-appearing.  HENT:     Head: Normocephalic.     Mouth/Throat:     Lips: Pink.     Mouth: Mucous membranes are moist.     Dentition: Abnormal dentition. Dental tenderness, gingival swelling, dental caries and gum lesions present.  Cardiovascular:     Rate and Rhythm: Normal rate.  Pulmonary:     Effort: Pulmonary effort is normal. No respiratory distress.  Skin:    General: Skin is warm and dry.  Neurological:     General: No focal deficit present.     Mental Status: He is alert and oriented to person, place, and time.  Psychiatric:        Mood and Affect: Mood normal.        Behavior: Behavior normal.     Procedures  No results found for this or any  previous visit (from the past 24 hours).  No results found.   Assessment and Plan :     Discharge Instructions       1. Dental infection (Primary) - diclofenac  (VOLTAREN ) 50 MG EC tablet; Take 1 tablet (50 mg total) by mouth 2 (two) times daily.  Dispense: 30 tablet; Refill: 1 - amoxicillin -clavulanate (AUGMENTIN ) 875-125 MG tablet; Take 1 tablet by mouth every 12 (twelve) hours for 10 days.  Dispense: 20 tablet; Refill: 1 - Advised washing mouth out 2-3 times a day with Listerine or other mouthwash to prevent further bacterial infection. - Follow-up with dentist soon as possible for further evaluation and management of ongoing dental infections. - List of local dentists and other dental resources provided during visit.  Please follow-up with 1 of these dental providers for ongoing  dental needs.      Yeshua Stryker B Starr Engel   [place], [place], [place] 05/02/24 [phone]
